# Patient Record
Sex: Female | Born: 1995 | Race: Black or African American | Hispanic: No | Marital: Single | Smoking: Never smoker
Health system: Southern US, Community
[De-identification: ages and names within clinical notes are randomized; demographics above are authoritative.]

## PROBLEM LIST (undated history)

## (undated) DIAGNOSIS — J45909 Unspecified asthma, uncomplicated: Secondary | ICD-10-CM

## (undated) DIAGNOSIS — I10 Essential (primary) hypertension: Secondary | ICD-10-CM

## (undated) DIAGNOSIS — Z9889 Other specified postprocedural states: Secondary | ICD-10-CM

## (undated) HISTORY — PX: OTHER SURGICAL HISTORY: SHX169

## (undated) HISTORY — DX: Essential (primary) hypertension: I10

## (undated) HISTORY — PX: HIP SURGERY: SHX245

---

## 2015-08-06 ENCOUNTER — Encounter (HOSPITAL_COMMUNITY): Payer: Self-pay | Admitting: Adult Health

## 2015-08-06 ENCOUNTER — Emergency Department (HOSPITAL_COMMUNITY)
Admission: EM | Admit: 2015-08-06 | Discharge: 2015-08-07 | Disposition: A | Discharge status: Home / Self Care | Payer: PRIVATE HEALTH INSURANCE | Attending: Emergency Medicine | Admitting: Emergency Medicine

## 2015-08-06 DIAGNOSIS — K2970 Gastritis, unspecified, without bleeding: Secondary | ICD-10-CM | POA: Insufficient documentation

## 2015-08-06 DIAGNOSIS — Z3202 Encounter for pregnancy test, result negative: Secondary | ICD-10-CM | POA: Diagnosis not present

## 2015-08-06 DIAGNOSIS — K529 Noninfective gastroenteritis and colitis, unspecified: Secondary | ICD-10-CM | POA: Diagnosis not present

## 2015-08-06 DIAGNOSIS — R1013 Epigastric pain: Secondary | ICD-10-CM | POA: Diagnosis present

## 2015-08-06 HISTORY — DX: Other specified postprocedural states: Z98.890

## 2015-08-06 LAB — COMPREHENSIVE METABOLIC PANEL
ALBUMIN: 3.5 g/dL (ref 3.5–5.0)
ALT: 15 U/L (ref 14–54)
AST: 22 U/L (ref 15–41)
Alkaline Phosphatase: 59 U/L (ref 38–126)
Anion gap: 12 (ref 5–15)
BUN: 7 mg/dL (ref 6–20)
CALCIUM: 8.9 mg/dL (ref 8.9–10.3)
CO2: 25 mmol/L (ref 22–32)
CREATININE: 0.69 mg/dL (ref 0.44–1.00)
Chloride: 102 mmol/L (ref 101–111)
Glucose, Bld: 104 mg/dL — ABNORMAL HIGH (ref 65–99)
Potassium: 3.7 mmol/L (ref 3.5–5.1)
SODIUM: 139 mmol/L (ref 135–145)
Total Bilirubin: 1.1 mg/dL (ref 0.3–1.2)
Total Protein: 7.1 g/dL (ref 6.5–8.1)

## 2015-08-06 LAB — URINALYSIS, ROUTINE W REFLEX MICROSCOPIC
Glucose, UA: NEGATIVE mg/dL
HGB URINE DIPSTICK: NEGATIVE
KETONES UR: NEGATIVE mg/dL
Leukocytes, UA: NEGATIVE
Nitrite: NEGATIVE
PROTEIN: NEGATIVE mg/dL
Specific Gravity, Urine: 1.028 (ref 1.005–1.030)
pH: 5.5 (ref 5.0–8.0)

## 2015-08-06 LAB — LIPASE, BLOOD: LIPASE: 18 U/L (ref 11–51)

## 2015-08-06 LAB — CBC
HCT: 41.3 % (ref 36.0–46.0)
Hemoglobin: 14 g/dL (ref 12.0–15.0)
MCH: 31.5 pg (ref 26.0–34.0)
MCHC: 33.9 g/dL (ref 30.0–36.0)
MCV: 92.8 fL (ref 78.0–100.0)
PLATELETS: 286 10*3/uL (ref 150–400)
RBC: 4.45 MIL/uL (ref 3.87–5.11)
RDW: 12.5 % (ref 11.5–15.5)
WBC: 5.1 10*3/uL (ref 4.0–10.5)

## 2015-08-06 LAB — POC URINE PREG, ED: PREG TEST UR: NEGATIVE

## 2015-08-06 MED ORDER — GLYCOPYRROLATE 0.2 MG/ML IJ SOLN
0.1000 mg | Freq: Once | INTRAMUSCULAR | Status: AC
  Administered 2015-08-07: 0.1 mg via INTRAVENOUS
  Filled 2015-08-06: qty 1

## 2015-08-06 MED ORDER — ONDANSETRON HCL 4 MG/2ML IJ SOLN
4.0000 mg | Freq: Once | INTRAMUSCULAR | Status: AC
  Administered 2015-08-07: 4 mg via INTRAVENOUS
  Filled 2015-08-06: qty 2

## 2015-08-06 MED ORDER — SODIUM CHLORIDE 0.9 % IV BOLUS (SEPSIS)
1000.0000 mL | Freq: Once | INTRAVENOUS | Status: AC
  Administered 2015-08-07: 1000 mL via INTRAVENOUS

## 2015-08-06 NOTE — ED Notes (Signed)
Presents with upper quadrant abdominal pain on both sides associated with nausea and emesis x1 began at 1 am today. Endorses loose stool x1 today. Is kepping applesauce and gatorade down. Explained to pt to not eat or drink anymore at this time, until evaluated by EDP. Abdominal pain rated 5/10 and described as gas pains

## 2015-08-06 NOTE — ED Notes (Signed)
MD at bedside. 

## 2015-08-06 NOTE — ED Notes (Signed)
RN attempt to start IV with no success; 2nd RN to start IV 

## 2015-08-07 MED ORDER — DICYCLOMINE HCL 20 MG PO TABS: 20.0000 mg | ORAL_TABLET | Freq: Two times a day (BID) | ORAL | Status: DC

## 2015-08-07 MED ORDER — ONDANSETRON 4 MG PO TBDP: 4.0000 mg | ORAL_TABLET | Freq: Three times a day (TID) | ORAL | Status: DC | PRN

## 2015-08-07 MED ORDER — OMEPRAZOLE 20 MG PO CPDR: 20.0000 mg | DELAYED_RELEASE_CAPSULE | Freq: Two times a day (BID) | ORAL | Status: DC

## 2015-08-07 NOTE — Discharge Instructions (Signed)

## 2015-08-07 NOTE — ED Provider Notes (Signed)
CSN: [id number]     Arrival date & time 08/06/15  2132 History   First MD Initiated Contact with Patient 08/06/15 2205     Chief Complaint  Patient presents with  . Abdominal Pain      HPI  Patient presents for evaluation of abdominal pain and nausea. He awakened proximal to 24 hours ago 1 AM with some nausea and epigastric cramping. One loose stool. Continued nausea during the day present only small amounts but has continued cramping and nausea and presents here. She states she ate some fish yesterday that "didn't settle" symptoms seem to worsen a few hours later. Heme-negative nonbilious emesis. No fevers no chills. No recent history of food intolerance. No lower quadrant pain per her last was her period was 2 weeks go normal. Urine no urinary symptoms.  Past Medical History  Diagnosis Date  . History of hip surgery    History reviewed. No pertinent past surgical history. History reviewed. No pertinent family history. Social History  Substance Use Topics  . Smoking status: Never Smoker   . Smokeless tobacco: None  . Alcohol Use: No   OB History    No data available     Review of Systems  Constitutional: Negative for fever, chills, diaphoresis, appetite change and fatigue.  HENT: Negative for mouth sores, sore throat and trouble swallowing.   Eyes: Negative for visual disturbance.  Respiratory: Negative for cough, chest tightness, shortness of breath and wheezing.   Cardiovascular: Negative for chest pain.  Gastrointestinal: Positive for nausea and abdominal pain. Negative for vomiting, diarrhea and abdominal distention.  Endocrine: Negative for polydipsia, polyphagia and polyuria.  Genitourinary: Negative for dysuria, frequency and hematuria.  Musculoskeletal: Negative for gait problem.  Skin: Negative for color change, pallor and rash.  Neurological: Negative for dizziness, syncope, light-headedness and headaches.  Hematological: Does not bruise/bleed easily.    Psychiatric/Behavioral: Negative for behavioral problems and confusion.      Allergies  Zyrtec  Home Medications   Prior to Admission medications   Medication Sig Start Date End Date Taking? Authorizing Provider  bismuth subsalicylate (PEPTO BISMOL) 262 MG/15ML suspension Take 30 mLs by mouth every 6 (six) hours as needed for diarrhea or loose stools.   Yes Historical Provider, MD  dicyclomine (BENTYL) 20 MG tablet Take 1 tablet (20 mg total) by mouth 2 (two) times daily. 08/07/15   Rolland Porter, MD  omeprazole (PRILOSEC) 20 MG capsule Take 1 capsule (20 mg total) by mouth 2 (two) times daily. 08/07/15   Rolland Porter, MD  ondansetron (ZOFRAN ODT) 4 MG disintegrating tablet Take 1 tablet (4 mg total) by mouth every 8 (eight) hours as needed for nausea. 08/07/15   Rolland Porter, MD   BP 143/78 mmHg  Pulse 94  Temp(Src) 100.1 F (37.8 C) (Oral)  Resp 18  SpO2 98%  LMP 08/01/2015 (Exact Date) Physical Exam  Constitutional: She is oriented to person, place, and time. She appears well-developed and well-nourished. No distress.  HENT:  Head: Normocephalic.  Eyes: Conjunctivae are normal. Pupils are equal, round, and reactive to light. No scleral icterus.  Neck: Normal range of motion. Neck supple. No thyromegaly present.  Cardiovascular: Normal rate and regular rhythm.  Exam reveals no gallop and no friction rub.   No murmur heard. Pulmonary/Chest: Effort normal and breath sounds normal. No respiratory distress. She has no wheezes. She has no rales.  Abdominal: Soft. Bowel sounds are normal. She exhibits no distension. There is no tenderness. There is no rebound.  Animal epigastric tenderness. No guarding rebound or perineal irritation.  Musculoskeletal: Normal range of motion.  Neurological: She is alert and oriented to person, place, and time.  Skin: Skin is warm and dry. No rash noted.  Psychiatric: She has a normal mood and affect. Her behavior is normal.    ED Course  Procedures  (including critical care time) Labs Review Labs Reviewed  COMPREHENSIVE METABOLIC PANEL - Abnormal; Notable for the following:    Glucose, Bld 104 (*)    All other components within normal limits  URINALYSIS, ROUTINE W REFLEX MICROSCOPIC (NOT AT [facility]) - Abnormal; Notable for the following:    Color, Urine AMBER (*)    APPearance HAZY (*)    Bilirubin Urine SMALL (*)    All other components within normal limits  LIPASE, BLOOD  CBC  POC URINE PREG, ED    Imaging Review No results found. I have personally reviewed and evaluated these images and lab results as part of my medical decision-making.   EKG Interpretation None      MDM   Final diagnoses:  Gastritis  Gastroenteritis    Given IV fluids, Robinul, Zofran. Feeling improved. He will take some by mouth. Plan will be home, Bentyl, Zofran, Prilosec. Slowly advance her diet. Avoid alcohol, tobacco, anti-inflammatories, caffeine.    Rolland Porter, MD 08/07/15 [place] Manners

## 2016-03-24 ENCOUNTER — Emergency Department (HOSPITAL_COMMUNITY)
Admission: EM | Admit: 2016-03-24 | Discharge: 2016-03-24 | Disposition: A | Discharge status: Home / Self Care | Payer: Medicaid Other | Attending: Emergency Medicine | Admitting: Emergency Medicine

## 2016-03-24 ENCOUNTER — Encounter (HOSPITAL_COMMUNITY): Payer: Self-pay | Admitting: Emergency Medicine

## 2016-03-24 ENCOUNTER — Emergency Department (HOSPITAL_COMMUNITY): Payer: Medicaid Other

## 2016-03-24 DIAGNOSIS — J40 Bronchitis, not specified as acute or chronic: Secondary | ICD-10-CM | POA: Insufficient documentation

## 2016-03-24 DIAGNOSIS — J069 Acute upper respiratory infection, unspecified: Secondary | ICD-10-CM

## 2016-03-24 DIAGNOSIS — Z79899 Other long term (current) drug therapy: Secondary | ICD-10-CM | POA: Insufficient documentation

## 2016-03-24 HISTORY — DX: Unspecified asthma, uncomplicated: J45.909

## 2016-03-24 MED ORDER — PREDNISONE 20 MG PO TABS
40.0000 mg | ORAL_TABLET | Freq: Once | ORAL | Status: AC
  Administered 2016-03-24: 40 mg via ORAL
  Filled 2016-03-24: qty 2

## 2016-03-24 MED ORDER — IPRATROPIUM-ALBUTEROL 0.5-2.5 (3) MG/3ML IN SOLN
3.0000 mL | RESPIRATORY_TRACT | Status: DC
  Administered 2016-03-24: 3 mL via RESPIRATORY_TRACT
  Filled 2016-03-24: qty 3

## 2016-03-24 MED ORDER — BENZONATATE 100 MG PO CAPS: 100.0000 mg | ORAL_CAPSULE | Freq: Three times a day (TID) | ORAL | 0 refills | Status: DC | PRN

## 2016-03-24 MED ORDER — ALBUTEROL SULFATE HFA 108 (90 BASE) MCG/ACT IN AERS
2.0000 | INHALATION_SPRAY | Freq: Once | RESPIRATORY_TRACT | Status: DC
  Filled 2016-03-24: qty 6.7

## 2016-03-24 MED ORDER — PREDNISONE 20 MG PO TABS: ORAL_TABLET | ORAL | 0 refills | Status: DC

## 2016-03-24 MED ORDER — ALBUTEROL SULFATE (5 MG/ML) 0.5% IN NEBU: 2.5000 mg | INHALATION_SOLUTION | Freq: Four times a day (QID) | RESPIRATORY_TRACT | 12 refills | Status: DC | PRN

## 2016-03-24 NOTE — ED Provider Notes (Signed)
[facility]-EMERGENCY DEPT Provider Note   CSN: [id number] Arrival date & time: 03/24/16  0747     History   Chief Complaint Chief Complaint  Patient presents with  . URI  . Asthma    HPI Tonya Krueger is a 20 y.o. female.  20 year old female who is here with a few days of cough, source of breath, chest pressure and shortness of breath. She has a history of asthma and had a sick contact with bronchitis. She denies fevers, chills. She does have a productive cough once a while of light yellow light green sputum. No Recent travels, other sick contacts, immunosuppression or other risk factors. She does not smoke. She is not using anything for her symptoms does not know what would make it better or worse.      Past Medical History:  Diagnosis Date  . Asthma   . History of hip surgery     There are no active problems to display for this patient.   Past Surgical History:  Procedure Laterality Date  . hip surgery      OB History    Gravida Para Term Preterm AB Living   1             SAB TAB Ectopic Multiple Live Births                   Home Medications    Prior to Admission medications   Medication Sig Start Date End Date Taking? Authorizing Provider  albuterol (PROVENTIL) (5 MG/ML) 0.5% nebulizer solution Take 0.5 mLs (2.5 mg total) by nebulization every 6 (six) hours as needed for wheezing or shortness of breath. 03/24/16   Marily MemosJason Yishai Rehfeld, MD  benzonatate (TESSALON) 100 MG capsule Take 1 capsule (100 mg total) by mouth 3 (three) times daily as needed for cough. 03/24/16   Marily MemosJason Carnell Beavers, MD  bismuth subsalicylate (PEPTO BISMOL) 262 MG/15ML suspension Take 30 mLs by mouth every 6 (six) hours as needed for diarrhea or loose stools.    Historical Provider, MD  dicyclomine (BENTYL) 20 MG tablet Take 1 tablet (20 mg total) by mouth 2 (two) times daily. 08/07/15   Tonya PorterMark James, MD  omeprazole (PRILOSEC) 20 MG capsule Take 1 capsule (20 mg total) by mouth 2 (two) times daily. 08/07/15    Tonya PorterMark James, MD  ondansetron (ZOFRAN ODT) 4 MG disintegrating tablet Take 1 tablet (4 mg total) by mouth every 8 (eight) hours as needed for nausea. 08/07/15   Tonya PorterMark James, MD  predniSONE (DELTASONE) 20 MG tablet 2 tabs po daily x 4 days 03/25/16   Marily MemosJason Abdiel Blackerby, MD    Family History No family history on file.  Social History Social History  Substance Use Topics  . Smoking status: Never Smoker  . Smokeless tobacco: Not on file  . Alcohol use No     Allergies   Zyrtec [cetirizine]   Review of Systems Review of Systems  Constitutional: Negative for activity change, appetite change, fatigue and fever.  HENT: Positive for congestion, postnasal drip and rhinorrhea.   Respiratory: Positive for cough, chest tightness and shortness of breath.   Cardiovascular: Positive for chest pain. Negative for palpitations and leg swelling.  Gastrointestinal: Negative for abdominal pain and nausea.  Genitourinary: Negative for dysuria.  Musculoskeletal: Negative for back pain.  All other systems reviewed and are negative.    Physical Exam Updated Vital Signs BP 124/61 (BP Location: Right Arm)   Pulse 80   Temp 98.4 F (36.9 C) (Oral)  Resp 20   Ht 5\' 8"  (1.727 m)   LMP 03/10/2016 Comment: not sexually active  SpO2 100%   Physical Exam  Constitutional: She appears well-developed and well-nourished. No distress.  HENT:  Head: Normocephalic and atraumatic.  Eyes: Conjunctivae are normal.  Neck: Neck supple.  Cardiovascular: Normal rate and regular rhythm.   No murmur heard. Pulmonary/Chest: Effort normal and breath sounds normal. No respiratory distress.  Decreased BS on R, mild end exp wheezing  Abdominal: Soft. There is no tenderness.  Musculoskeletal: She exhibits no edema.  Neurological: She is alert.  Skin: Skin is warm and dry.  Psychiatric: She has a normal mood and affect.  Nursing note and vitals reviewed.    ED Treatments / Results  Labs (all labs ordered are listed,  but only abnormal results are displayed) Labs Reviewed - No data to display  EKG  EKG Interpretation  Date/Time:  Monday March 24 2016 09:42:39 EDT Ventricular Rate:  71 PR Interval:    QRS Duration: 73 QT Interval:  377 QTC Calculation: 410 R Axis:   84 Text Interpretation:  Sinus rhythm Atrial premature complex No old tracing to compare Confirmed by [facility] MD, Barbara Cower [phone]) on 03/24/2016 9:46:59 AM       Radiology Dg Chest 2 View  Result Date: 03/24/2016 CLINICAL DATA:  Chest pain and shortness of breath.  Asthma. EXAM: CHEST  2 VIEW COMPARISON:  None. FINDINGS: Heart size is exaggerated by low lung volumes. Mild diffuse interstitial prominence is noted. No focal airspace disease is evident. Central airway thickening is present. The visualized soft tissues and bony thorax are unremarkable. IMPRESSION: 1. Moderate central airway thickening and interstitial prominence suggesting reactive airways disease. 2. No focal airspace disease. 3. Low lung volumes. Electronically Signed   By: Marin Roberts M.D.   On: 03/24/2016 10:16    Procedures Procedures (including critical care time)  Medications Ordered in ED Medications  ipratropium-albuterol (DUONEB) 0.5-2.5 (3) MG/3ML nebulizer solution 3 mL (3 mLs Nebulization Given 03/24/16 0940)  albuterol (PROVENTIL HFA;VENTOLIN HFA) 108 (90 Base) MCG/ACT inhaler 2 puff (not administered)  predniSONE (DELTASONE) tablet 40 mg (40 mg Oral Given 03/24/16 1022)     Initial Impression / Assessment and Plan / ED Course  I have reviewed the triage vital signs and the nursing notes.  Pertinent labs & imaging results that were available during my care of the patient were reviewed by me and considered in my medical decision making (see chart for details).  Clinical Course   Likely bronchitis. Will give duoneb/steroids. No indication for abx right now. Will cxr and ecg to ensure no other abnormalities.   Likely URI with asthma exacerbation.  Plan for supportive care at home.   Final Clinical Impressions(s) / ED Diagnoses   Final diagnoses:  URI (upper respiratory infection)  Bronchitis    New Prescriptions New Prescriptions   ALBUTEROL (PROVENTIL) (5 MG/ML) 0.5% NEBULIZER SOLUTION    Take 0.5 mLs (2.5 mg total) by nebulization every 6 (six) hours as needed for wheezing or shortness of breath.   BENZONATATE (TESSALON) 100 MG CAPSULE    Take 1 capsule (100 mg total) by mouth 3 (three) times daily as needed for cough.   PREDNISONE (DELTASONE) 20 MG TABLET    2 tabs po daily x 4 days     Marily Memos, MD 03/24/16 1029

## 2016-03-24 NOTE — ED Triage Notes (Addendum)
Pt states was dx with seasonal asthma in past-- started having URI symptoms on Friday-- does not have inhalers -- out of rx at present-- no resp distress at triage.  Pt states cough is productive for green mucus

## 2016-05-22 ENCOUNTER — Emergency Department (HOSPITAL_COMMUNITY)
Admission: EM | Admit: 2016-05-22 | Discharge: 2016-05-22 | Disposition: A | Discharge status: Home / Self Care | Payer: Medicaid Other | Attending: Emergency Medicine | Admitting: Emergency Medicine

## 2016-05-22 ENCOUNTER — Encounter (HOSPITAL_COMMUNITY): Payer: Self-pay | Admitting: Emergency Medicine

## 2016-05-22 DIAGNOSIS — J45909 Unspecified asthma, uncomplicated: Secondary | ICD-10-CM | POA: Insufficient documentation

## 2016-05-22 DIAGNOSIS — R109 Unspecified abdominal pain: Secondary | ICD-10-CM | POA: Insufficient documentation

## 2016-05-22 LAB — COMPREHENSIVE METABOLIC PANEL
ALBUMIN: 3.7 g/dL (ref 3.5–5.0)
ALT: 12 U/L — AB (ref 14–54)
AST: 15 U/L (ref 15–41)
Alkaline Phosphatase: 55 U/L (ref 38–126)
Anion gap: 5 (ref 5–15)
BUN: 8 mg/dL (ref 6–20)
CHLORIDE: 107 mmol/L (ref 101–111)
CO2: 26 mmol/L (ref 22–32)
CREATININE: 0.58 mg/dL (ref 0.44–1.00)
Calcium: 9.1 mg/dL (ref 8.9–10.3)
GFR calc Af Amer: >60 mL/min (ref >60)
GFR calc non Af Amer: >60 mL/min (ref >60)
GLUCOSE: 94 mg/dL (ref 65–99)
POTASSIUM: 4.2 mmol/L (ref 3.5–5.1)
SODIUM: 138 mmol/L (ref 135–145)
Total Bilirubin: 0.9 mg/dL (ref 0.3–1.2)
Total Protein: 6.9 g/dL (ref 6.5–8.1)

## 2016-05-22 LAB — CBC WITH DIFFERENTIAL/PLATELET
BASOS ABS: 0 10*3/uL (ref 0.0–0.1)
BASOS PCT: 1 %
EOS ABS: 0 10*3/uL (ref 0.0–0.7)
EOS PCT: 1 %
HCT: 39.1 % (ref 36.0–46.0)
Hemoglobin: 13.1 g/dL (ref 12.0–15.0)
LYMPHS PCT: 34 %
Lymphs Abs: 1.9 10*3/uL (ref 0.7–4.0)
MCH: 31.5 pg (ref 26.0–34.0)
MCHC: 33.5 g/dL (ref 30.0–36.0)
MCV: 94 fL (ref 78.0–100.0)
Monocytes Absolute: 0.5 10*3/uL (ref 0.1–1.0)
Monocytes Relative: 9 %
Neutro Abs: 3.1 10*3/uL (ref 1.7–7.7)
Neutrophils Relative %: 55 %
PLATELETS: 305 10*3/uL (ref 150–400)
RBC: 4.16 MIL/uL (ref 3.87–5.11)
RDW: 12.8 % (ref 11.5–15.5)
WBC: 5.5 10*3/uL (ref 4.0–10.5)

## 2016-05-22 LAB — URINALYSIS, ROUTINE W REFLEX MICROSCOPIC
BILIRUBIN URINE: NEGATIVE
Glucose, UA: NEGATIVE mg/dL
Hgb urine dipstick: NEGATIVE
Ketones, ur: NEGATIVE mg/dL
Leukocytes, UA: NEGATIVE
NITRITE: NEGATIVE
PROTEIN: NEGATIVE mg/dL
SPECIFIC GRAVITY, URINE: 1.017 (ref 1.005–1.030)
pH: 5.5 (ref 5.0–8.0)

## 2016-05-22 LAB — POC URINE PREG, ED: PREG TEST UR: NEGATIVE

## 2016-05-22 LAB — LIPASE, BLOOD: Lipase: 25 U/L (ref 11–51)

## 2016-05-22 MED ORDER — MELOXICAM 15 MG PO TABS: 15.0000 mg | ORAL_TABLET | Freq: Every day | ORAL | 0 refills | Status: DC

## 2016-05-22 NOTE — ED Notes (Signed)
Pt reports hx of the same pain in the past that went away on it's own after "some time."  Ambulatory to room with a quick easy step, smiling with visitor.

## 2016-05-22 NOTE — ED Triage Notes (Signed)
Pt sts left sided flank pain x 2 days into back; pt denies urinary sx

## 2016-05-22 NOTE — ED Provider Notes (Signed)
[facility] Provider Note   CSN: [id number] Arrival date & time: 05/22/16  0810     History   Chief Complaint Chief Complaint  Patient presents with  . Flank Pain    HPI Tonya Krueger is a 20 y.o. female who presents with left sided flank pain. PMH significant for asthma and hip pinning. She states that 2 days ago she started having pain over the left side of her back. The pain is constant. Movement makes it worse. Nothing has made it better. She has had this pain before and it has gone away on its own. Never been evaluated by a medical provider before for this problem. She denies any associated symptoms including fever, chills, abdominal pain, N/V/D, urinary symptoms, vaginal discharge or bleeding. She has not tried any medicines to help with her symptoms. 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   HPI  Past Medical History:  Diagnosis Date  . Asthma   . History of hip surgery     There are no active problems to display for this patient.   Past Surgical History:  Procedure Laterality Date  . hip surgery      OB History    Gravida Para Term Preterm AB Living   1             SAB TAB Ectopic Multiple Live Births                   Home Medications    Prior to Admission medications   Medication Sig Start Date End Date Taking? Authorizing Provider  albuterol (PROVENTIL) (5 MG/ML) 0.5% nebulizer solution Take 0.5 mLs (2.5 mg total) by nebulization every 6 (six) hours as needed for wheezing or shortness of breath. 03/24/16   Marily MemosJason Mesner, MD  benzonatate (TESSALON) 100 MG capsule Take 1 capsule (100 mg total) by mouth 3 (three) times daily as needed for cough. 03/24/16   Marily MemosJason Mesner, MD  bismuth subsalicylate (PEPTO BISMOL) 262 MG/15ML suspension Take 30 mLs by mouth every 6 (six) hours as needed for diarrhea or loose stools.    Historical Provider, MD  dicyclomine (BENTYL) 20 MG tablet Take 1 tablet (20 mg total) by mouth 2 (two) times daily.  08/07/15   Tonya PorterMark James, MD  omeprazole (PRILOSEC) 20 MG capsule Take 1 capsule (20 mg total) by mouth 2 (two) times daily. 08/07/15   Tonya PorterMark James, MD  ondansetron (ZOFRAN ODT) 4 MG disintegrating tablet Take 1 tablet (4 mg total) by mouth every 8 (eight) hours as needed for nausea. 08/07/15   Tonya PorterMark James, MD  predniSONE (DELTASONE) 20 MG tablet 2 tabs po daily x 4 days 03/25/16   Marily MemosJason Mesner, MD    Family History History reviewed. No pertinent family history.  Social History Social History  Substance Use Topics  . Smoking status: Never Smoker  . Smokeless tobacco: Not on file  . Alcohol use No     Allergies   Zyrtec [cetirizine]   Review of Systems Review of Systems  Constitutional: Negative for chills and fever.  Gastrointestinal: Negative for abdominal pain, diarrhea, nausea and vomiting.  Genitourinary: Positive for flank pain. Negative for dysuria, frequency, vaginal bleeding and vaginal discharge.  Musculoskeletal: Negative for back pain and gait problem.     Physical Exam Updated Vital Signs BP 133/78   Pulse 73   Temp 97.8 F (36.6 C) (Oral)   Resp 18   LMP 03/10/2016 Comment: not sexually active  SpO2 100%  Physical Exam  Constitutional: She is oriented to person, place, and time. She appears well-developed and well-nourished. No distress.  Obese, NAD. Appears comfortable.  HENT:  Head: Normocephalic and atraumatic.  Eyes: Conjunctivae are normal. Pupils are equal, round, and reactive to light. Right eye exhibits no discharge. Left eye exhibits no discharge. No scleral icterus.  Neck: Normal range of motion.  Cardiovascular: Normal rate and regular rhythm.  Exam reveals no gallop and no friction rub.   No murmur heard. Pulmonary/Chest: Breath sounds normal. No respiratory distress. She has no wheezes. She has no rales. She exhibits no tenderness.  Abdominal: Soft. Bowel sounds are normal. She exhibits no distension and no mass. There is no tenderness. There is no  rebound and no guarding. No hernia.  No CVA tenderness. Tenderness reproducible to palpation over left flank. No midline tenderness or paraspinal muscle tenderness   Neurological: She is alert and oriented to person, place, and time.  Skin: Skin is warm and dry.  Psychiatric: She has a normal mood and affect. Her behavior is normal.  Nursing note and vitals reviewed.    ED Treatments / Results  Labs (all labs ordered are listed, but only abnormal results are displayed) Labs Reviewed  COMPREHENSIVE METABOLIC PANEL - Abnormal; Notable for the following:       Result Value   ALT 12 (*)    All other components within normal limits  URINALYSIS, ROUTINE W REFLEX MICROSCOPIC (NOT AT [facility])  CBC WITH DIFFERENTIAL/PLATELET  LIPASE, BLOOD  POC URINE PREG, ED    EKG  EKG Interpretation None       Radiology No results found.  Procedures Procedures (including critical care time)  Medications Ordered in ED Medications - No data to display   Initial Impression / Assessment and Plan / ED Course  I have reviewed the triage vital signs and the nursing notes.  Pertinent labs & imaging results that were available during my care of the patient were reviewed by me and considered in my medical decision making (see chart for details).  Clinical Course    20 year old female presents with symptoms consistent with MSK flank/back pain. Patient is afebrile, not tachycardic or tachypneic, and not hypoxic. She is mildly hypertensive. Labs and UA are unremarkable. She appears comfortable and in minimal pain. Rx given for course of NSAIDs. Patient is NAD, non-toxic, with stable VS. Patient is informed of clinical course, understands medical decision making process, and agrees with plan. Opportunity for questions provided and all questions answered. Return precautions given.   Final Clinical Impressions(s) / ED Diagnoses   Final diagnoses:  Left flank pain    New Prescriptions New  Prescriptions   No medications on file     Bethel BornKelly Marie Gekas, PA-C 05/24/16 0945    Marily MemosJason Mesner, MD 05/24/16 1114

## 2016-05-22 NOTE — ED Notes (Signed)
Pt states she understands instructions. All questions answered.HOme stable with friend with steady gait.

## 2016-06-04 ENCOUNTER — Encounter (HOSPITAL_COMMUNITY): Payer: Self-pay | Admitting: *Deleted

## 2016-06-04 ENCOUNTER — Inpatient Hospital Stay (HOSPITAL_COMMUNITY)
Admission: AD | Admit: 2016-06-04 | Discharge: 2016-06-04 | Disposition: A | Discharge status: Home / Self Care | Payer: Medicaid Other | Source: Ambulatory Visit | Attending: Obstetrics & Gynecology | Admitting: Obstetrics & Gynecology

## 2016-06-04 DIAGNOSIS — N946 Dysmenorrhea, unspecified: Secondary | ICD-10-CM

## 2016-06-04 DIAGNOSIS — N939 Abnormal uterine and vaginal bleeding, unspecified: Secondary | ICD-10-CM | POA: Diagnosis not present

## 2016-06-04 DIAGNOSIS — N920 Excessive and frequent menstruation with regular cycle: Secondary | ICD-10-CM | POA: Diagnosis present

## 2016-06-04 DIAGNOSIS — Z3202 Encounter for pregnancy test, result negative: Secondary | ICD-10-CM | POA: Insufficient documentation

## 2016-06-04 LAB — URINALYSIS, ROUTINE W REFLEX MICROSCOPIC
BILIRUBIN URINE: NEGATIVE
Glucose, UA: NEGATIVE mg/dL
Ketones, ur: NEGATIVE mg/dL
Leukocytes, UA: NEGATIVE
NITRITE: NEGATIVE
PH: 7.5 (ref 5.0–8.0)
Protein, ur: NEGATIVE mg/dL
SPECIFIC GRAVITY, URINE: 1.015 (ref 1.005–1.030)

## 2016-06-04 LAB — POCT PREGNANCY, URINE: PREG TEST UR: NEGATIVE

## 2016-06-04 LAB — CBC
HEMATOCRIT: 37.7 % (ref 36.0–46.0)
Hemoglobin: 12.9 g/dL (ref 12.0–15.0)
MCH: 31.6 pg (ref 26.0–34.0)
MCHC: 34.2 g/dL (ref 30.0–36.0)
MCV: 92.4 fL (ref 78.0–100.0)
PLATELETS: 318 10*3/uL (ref 150–400)
RBC: 4.08 MIL/uL (ref 3.87–5.11)
RDW: 12.9 % (ref 11.5–15.5)
WBC: 5.2 10*3/uL (ref 4.0–10.5)

## 2016-06-04 LAB — URINE MICROSCOPIC-ADD ON
BACTERIA UA: NONE SEEN
WBC UA: NONE SEEN WBC/hpf (ref 0–5)

## 2016-06-04 MED ORDER — KETOROLAC TROMETHAMINE 60 MG/2ML IM SOLN
60.0000 mg | Freq: Once | INTRAMUSCULAR | Status: AC
  Administered 2016-06-04: 60 mg via INTRAMUSCULAR
  Filled 2016-06-04: qty 2

## 2016-06-04 MED ORDER — MEDROXYPROGESTERONE ACETATE 10 MG PO TABS: 10.0000 mg | ORAL_TABLET | Freq: Every day | ORAL | 0 refills | Status: DC

## 2016-06-04 MED ORDER — TRAMADOL HCL 50 MG PO TABS: 50.0000 mg | ORAL_TABLET | Freq: Four times a day (QID) | ORAL | 0 refills | Status: DC | PRN

## 2016-06-04 NOTE — Discharge Instructions (Signed)
Dysmenorrhea °Dysmenorrhea is pain during a menstrual period. You will have pain in the lower belly (abdomen). The pain is caused by the tightening (contracting) of the muscles of the uterus. The pain can be minor or severe. Headache, feeling sick to your stomach (nausea), throwing up (vomiting), or low back pain may occur with this condition. °Follow these instructions at home: °· Only take medicine as told by your doctor. °· Place a heating pad or hot water bottle on your lower back or belly. Do not sleep with a heating pad. °· Exercise may help lessen the pain. °· Massage the lower back or belly. °· Stop smoking. °· Avoid alcohol and caffeine. °Contact a doctor if: °· Your pain does not get better with medicine. °· You have pain during sex. °· Your pain gets worse while taking pain medicine. °· Your period bleeding is heavier than normal. °· You keep feeling sick to your stomach or keep throwing up. °Get help right away if: °· You pass out (faint). °This information is not intended to replace advice given to you by your health care provider. Make sure you discuss any questions you have with your health care provider. °Document Released: 09/19/2008 Document Revised: 11/29/2015 Document Reviewed: 12/09/2012 °Elsevier Interactive Patient Education © 2017 [employer] ° °

## 2016-06-04 NOTE — MAU Note (Signed)
Came in because menstrual cycles are really, really bad.  Really heavy and bad clots.  Cycles are always heavy, but the last 2 have been worse. Lasting longer and bad cramps

## 2016-06-04 NOTE — MAU Provider Note (Signed)
History     CSN: [id number]  Arrival date and time: 06/04/16 1416   First Provider Initiated Contact with Patient 06/04/16 1556      Chief Complaint  Patient presents with  . Menorrhagia  . Abdominal Pain   HPI   Ms.Rolland Porterya Dillavou is a 20 y.o. female G0P0 here in MAU with vaginal bleeding and abdominal pain. She has a history of heavy, painful periods. She has been worked up by her GYN due to a family history of uterine fibroids; all tests were negative. Her GYN Dr. Is in [place]. She has tried OCP's without relief. She is saturating more than a pad an hour. She has tried ibuprofen without much relief.   OB History    Gravida Para Term Preterm AB Living   0             SAB TAB Ectopic Multiple Live Births                  Past Medical History:  Diagnosis Date  . Asthma   . History of hip surgery     Past Surgical History:  Procedure Laterality Date  . hip surgery      History reviewed. No pertinent family history.  Social History  Substance Use Topics  . Smoking status: Never Smoker  . Smokeless tobacco: Not on file  . Alcohol use No    Allergies:  Allergies  Allergen Reactions  . Zyrtec [Cetirizine]     Itching and swelling    Prescriptions Prior to Admission  Medication Sig Dispense Refill Last Dose  . albuterol (PROVENTIL) (5 MG/ML) 0.5% nebulizer solution Take 0.5 mLs (2.5 mg total) by nebulization every 6 (six) hours as needed for wheezing or shortness of breath. 20 mL 12   . benzonatate (TESSALON) 100 MG capsule Take 1 capsule (100 mg total) by mouth 3 (three) times daily as needed for cough. 21 capsule 0   . bismuth subsalicylate (PEPTO BISMOL) 262 MG/15ML suspension Take 30 mLs by mouth every 6 (six) hours as needed for diarrhea or loose stools.   08/06/2015 at Unknown time  . dicyclomine (BENTYL) 20 MG tablet Take 1 tablet (20 mg total) by mouth 2 (two) times daily. 20 tablet 0   . meloxicam (MOBIC) 15 MG tablet Take 1 tablet (15 mg total) by mouth  daily. 30 tablet 0   . omeprazole (PRILOSEC) 20 MG capsule Take 1 capsule (20 mg total) by mouth 2 (two) times daily. 60 capsule 1   . ondansetron (ZOFRAN ODT) 4 MG disintegrating tablet Take 1 tablet (4 mg total) by mouth every 8 (eight) hours as needed for nausea. 6 tablet 0   . predniSONE (DELTASONE) 20 MG tablet 2 tabs po daily x 4 days 8 tablet 0    Results for orders placed or performed during the hospital encounter of 06/04/16 (from the past 48 hour(s))  Urinalysis, Routine w reflex microscopic (not at [facility])     Status: Abnormal   Collection Time: 06/04/16  2:53 PM  Result Value Ref Range   Color, Urine YELLOW YELLOW   APPearance CLEAR CLEAR   Specific Gravity, Urine 1.015 1.005 - 1.030   pH 7.5 5.0 - 8.0   Glucose, UA NEGATIVE NEGATIVE mg/dL   Hgb urine dipstick MODERATE (A) NEGATIVE   Bilirubin Urine NEGATIVE NEGATIVE   Ketones, ur NEGATIVE NEGATIVE mg/dL   Protein, ur NEGATIVE NEGATIVE mg/dL   Nitrite NEGATIVE NEGATIVE   Leukocytes, UA NEGATIVE NEGATIVE  Urine microscopic-add  on     Status: Abnormal   Collection Time: 06/04/16  2:53 PM  Result Value Ref Range   Squamous Epithelial / LPF 0-5 (A) NONE SEEN   WBC, UA NONE SEEN 0 - 5 WBC/hpf   RBC / HPF 6-30 0 - 5 RBC/hpf   Bacteria, UA NONE SEEN NONE SEEN  Pregnancy, urine POC     Status: None   Collection Time: 06/04/16  3:00 PM  Result Value Ref Range   Preg Test, Ur NEGATIVE NEGATIVE    Comment:        THE SENSITIVITY OF THIS METHODOLOGY IS >24 mIU/mL   CBC     Status: None   Collection Time: 06/04/16  4:01 PM  Result Value Ref Range   WBC 5.2 4.0 - 10.5 K/uL   RBC 4.08 3.87 - 5.11 MIL/uL   Hemoglobin 12.9 12.0 - 15.0 g/dL   HCT 78.237.7 95.636.0 - 21.346.0 %   MCV 92.4 78.0 - 100.0 fL   MCH 31.6 26.0 - 34.0 pg   MCHC 34.2 30.0 - 36.0 g/dL   RDW 08.612.9 57.811.5 - 46.915.5 %   Platelets 318 150 - 400 K/uL    Review of Systems  Gastrointestinal: Positive for abdominal pain.  Neurological: Positive for weakness. Negative for  dizziness.   Physical Exam   Blood pressure 136/89, pulse 73, temperature 98.6 F (37 C), temperature source Oral, resp. rate 20, weight (!) 305 lb 3.2 oz (138.4 kg), last menstrual period 06/02/2016, unknown if currently breastfeeding.  Physical Exam  Constitutional: She is oriented to person, place, and time. She appears well-developed and well-nourished. No distress.  HENT:  Head: Normocephalic.  Eyes: Pupils are equal, round, and reactive to light.  GI: Soft. She exhibits no distension. There is no tenderness. There is no rebound.  Genitourinary:  Genitourinary Comments: Vagina - Small amount of dark red blood in the vagina. Exam difficult due to patient's discomfort No contact bleeding, + active bleeding  Bimanual exam: Cervix closed Uterus non tender, normal size Adnexa non tender, no masses bilaterally Chaperone present for exam.   Musculoskeletal: Normal range of motion.  Neurological: She is alert and oriented to person, place, and time.  Skin: Skin is warm. She is not diaphoretic.  Psychiatric: Her behavior is normal.    MAU Course  Procedures  None   MDM  CBC showing stable Hgb Toradol 60 mg IM: "some relief".   Assessment and Plan   A:  1. Abnormal vaginal bleeding   2. Painful menstrual periods     P:  Discharge home in stable condition Patient has follow up in [place]; encouraged patient to follow up in the next week. Discussed IUD as an option  Rx: Ultram, Provera Bleeding precautions  Return to MAU as needed if symptoms worsen    Duane LopeJennifer I Peniel Hass, NP 06/04/2016 5:32 PM

## 2016-06-04 NOTE — MAU Note (Cosign Needed)
MAU HISTORY AND PHYSICAL  Chief Complaint:  Dysmenorrhea with pelvic cramping and menorrhagia  Tonya Porterya Harroun is a 20 y.o.  G0P0 presenting for dysmenorrhea and menorrhagia. Patient is on day 3 of her menstrual cycle and reports that both this cycle and her last cycle last month (which lasted 2 weeks) have been much heavier than usual with increased pelvic cramping. She has had heavy periods since menarche at age 20; periods occur each month but are unpredictable in day of onset and duration. She has been soaking through at least a pad an hour and sometimes uses double protection (super plus tampon with pad). During sleep, she has used a towel on the bed to avoid soaking into the sheets. She does have an ob/gyn in [place] who did an ultrasound but says it was a couple of years ago; she also tried OCPs for bleeding in the past with no improvement. She also reports feeling fatigued with body aches during her cycle. She denies ever having been sexually active. She denies fever, night sweats, chest pain, shortness of breath, vomiting, diarrhea, rashes, masses, and swelling.   Past Medical History:  Diagnosis Date  . Asthma   . History of hip surgery     Past Surgical History:  Procedure Laterality Date  . hip surgery bilaterally (SCFE) as teenager     Family History:  Mother diagnosed with uterine fibroids and cysts; aunt died from blood disorder but patient is unsure of name; thinks it could have been TTP.  Social History  Substance Use Topics  . Smoking status: Never Smoker  . Smokeless tobacco: Not on file  . Alcohol use No    Allergies  Allergen Reactions  . Zyrtec [Cetirizine]     Itching and swelling    Prescriptions Prior to Admission  Medication Sig Dispense Refill Last Dose  . albuterol (PROVENTIL) (5 MG/ML) 0.5% nebulizer solution Take 0.5 mLs (2.5 mg total) by nebulization every 6 (six) hours as needed for wheezing or shortness of breath. 20 mL 12    Review of Systems -  Negative except for what is mentioned in HPI.  Physical Exam  Blood pressure 136/89, pulse 73, temperature 98.6 F (37 C), temperature source Oral, resp. rate 20, weight (!) 138.4 kg (305 lb 3.2 oz), last menstrual period 06/02/2016 GEN: NAD, alert HEENT: NCAT, moist, pink mucous membranes NECK: No lymphadenopathy or thyromegaly PULM: Normal WOB on RA; lungs CTAB CV: RRR, no m/r/g ABD: NABS, NTND GU: normal external genitalia with no lesions; moderate blood in the vaginal vault; cervix not visualized (speculum exam terminated due to discomfort); no CMT or adnexal tenderness; no adnexal masses appreciated SKIN: warm and dry NEURO: moving all extremities spontaneously PSYCH: normal mood and affect  Labs: Results for orders placed or performed during the hospital encounter of 06/04/16 (from the past 24 hour(s))  Urinalysis, Routine w reflex microscopic (not at [facility])   Collection Time: 06/04/16  2:53 PM  Result Value Ref Range   Color, Urine YELLOW YELLOW   APPearance CLEAR CLEAR   Specific Gravity, Urine 1.015 1.005 - 1.030   pH 7.5 5.0 - 8.0   Glucose, UA NEGATIVE NEGATIVE mg/dL   Hgb urine dipstick MODERATE (A) NEGATIVE   Bilirubin Urine NEGATIVE NEGATIVE   Ketones, ur NEGATIVE NEGATIVE mg/dL   Protein, ur NEGATIVE NEGATIVE mg/dL   Nitrite NEGATIVE NEGATIVE   Leukocytes, UA NEGATIVE NEGATIVE  Urine microscopic-add on   Collection Time: 06/04/16  2:53 PM  Result Value Ref Range  Squamous Epithelial / LPF 0-5 (A) NONE SEEN   WBC, UA NONE SEEN 0 - 5 WBC/hpf   RBC / HPF 6-30 0 - 5 RBC/hpf   Bacteria, UA NONE SEEN NONE SEEN  Pregnancy, urine POC   Collection Time: 06/04/16  3:00 PM  Result Value Ref Range   Preg Test, Ur NEGATIVE NEGATIVE    Imaging Studies:  No results found.  Assessment: Tonya Krueger is  20 y.o. G0P0 on day 3 of her menstrual cycle with dysmenorrhea and pelvic cramping with menorrhagia that has worsened during the last two monthly cycles with some  fatigue but reassuring CBC (hgb 12.9 today versus 13.1 on 05/22/16). UA and urine pregnancy negative.  Plan: Patient received Toradol x 1 but still had pelvic cramping; she thinks she feels well enough to go to class tomorrow. Tramadol 50 mg every 6 hours (15 tablets total) for pain control at discharge. Recommended 2 weeks of treatment with Provera (10 mg/day) for control of menorrhagia and advised patient to follow up with her primary doctor over winter break to consider placement of Mirena IUD for better symptom control.   Ennis FortsJessica Vaani Morren, Medical Student [facility] 06/04/2016

## 2016-07-13 ENCOUNTER — Encounter (HOSPITAL_COMMUNITY): Payer: Self-pay | Admitting: Emergency Medicine

## 2016-07-13 ENCOUNTER — Emergency Department (HOSPITAL_COMMUNITY)
Admission: EM | Admit: 2016-07-13 | Discharge: 2016-07-13 | Disposition: A | Discharge status: Home / Self Care | Payer: Medicaid Other | Attending: Emergency Medicine | Admitting: Emergency Medicine

## 2016-07-13 ENCOUNTER — Emergency Department (HOSPITAL_COMMUNITY): Payer: Medicaid Other

## 2016-07-13 DIAGNOSIS — R05 Cough: Secondary | ICD-10-CM | POA: Diagnosis not present

## 2016-07-13 DIAGNOSIS — J45909 Unspecified asthma, uncomplicated: Secondary | ICD-10-CM | POA: Diagnosis not present

## 2016-07-13 DIAGNOSIS — R6889 Other general symptoms and signs: Secondary | ICD-10-CM

## 2016-07-13 MED ORDER — MOMETASONE FUROATE 50 MCG/ACT NA SUSP: 2.0000 | Freq: Every day | NASAL | 12 refills | Status: DC

## 2016-07-13 MED ORDER — BENZONATATE 100 MG PO CAPS: 100.0000 mg | ORAL_CAPSULE | Freq: Three times a day (TID) | ORAL | 0 refills | Status: DC | PRN

## 2016-07-13 NOTE — ED Notes (Signed)
Declined W/C at D/C and was escorted to lobby by RN. 

## 2016-07-13 NOTE — ED Provider Notes (Signed)
[facility]-EMERGENCY DEPT Provider Note   CSN: [id number] Arrival date & time: 07/13/16  1333  By signing my name below, I, Tonya Krueger, attest that this documentation has been prepared under the direction and in the presence of Loren Racer, MD. Electronically Signed: Alyssa Krueger, ED Scribe. 07/13/16. 2:26 PM.  History   Chief Complaint Chief Complaint  Patient presents with  . Cough   The history is provided by the patient. No language interpreter was used.   HPI Comments: Tonya Krueger is a 21 y.o. female who presents to the Emergency Department complaining of generalized body aches for 6 days. Pt reports associated non productive cough, rhinorrhea, nasal congestion, sneezing, Bilateral ear pressure, and intermittent chills. She has taken Mucinex Cold and Flu, but she has stopped taking that medication due to upset stomach. She is allergic to Zyrtec (throat itching) and has not taken any other antihistamines. Pt has an inhaler at home but has not used it. She reports recent sick contacts with flu-like illness. Pt denies fever, neck pain, back pain, chest pain, abdominal pain, wheezing.  Past Medical History:  Diagnosis Date  . Asthma   . History of hip surgery     There are no active problems to display for this patient.   Past Surgical History:  Procedure Laterality Date  . hip surgery      OB History    Gravida Para Term Preterm AB Living   0             SAB TAB Ectopic Multiple Live Births                   Home Medications    Prior to Admission medications   Medication Sig Start Date End Date Taking? Authorizing Provider  albuterol (PROVENTIL) (5 MG/ML) 0.5% nebulizer solution Take 0.5 mLs (2.5 mg total) by nebulization every 6 (six) hours as needed for wheezing or shortness of breath. 03/24/16   Marily Memos, MD  benzonatate (TESSALON) 100 MG capsule Take 1 capsule (100 mg total) by mouth 3 (three) times daily as needed for cough. 07/13/16   Loren Racer, MD    bismuth subsalicylate (PEPTO BISMOL) 262 MG/15ML suspension Take 30 mLs by mouth every 6 (six) hours as needed for diarrhea or loose stools.    Historical Provider, MD  dicyclomine (BENTYL) 20 MG tablet Take 1 tablet (20 mg total) by mouth 2 (two) times daily. 08/07/15   Rolland Porter, MD  medroxyPROGESTERone (PROVERA) 10 MG tablet Take 1 tablet (10 mg total) by mouth daily. 06/04/16   Duane Lope, NP  meloxicam (MOBIC) 15 MG tablet Take 1 tablet (15 mg total) by mouth daily. 05/22/16   Bethel Born, PA-C  mometasone (NASONEX) 50 MCG/ACT nasal spray Place 2 sprays into the nose daily. 07/13/16   Loren Racer, MD  omeprazole (PRILOSEC) 20 MG capsule Take 1 capsule (20 mg total) by mouth 2 (two) times daily. 08/07/15   Rolland Porter, MD  ondansetron (ZOFRAN ODT) 4 MG disintegrating tablet Take 1 tablet (4 mg total) by mouth every 8 (eight) hours as needed for nausea. 08/07/15   Rolland Porter, MD  predniSONE (DELTASONE) 20 MG tablet 2 tabs po daily x 4 days 03/25/16   Marily Memos, MD  traMADol (ULTRAM) 50 MG tablet Take 1 tablet (50 mg total) by mouth every 6 (six) hours as needed. 06/04/16   Duane Lope, NP    Family History No family history on file.  Social History Social History  Substance Use Topics  . Smoking status: Never Smoker  . Smokeless tobacco: Never Used  . Alcohol use No     Allergies   Zyrtec [cetirizine]   Review of Systems Review of Systems  Constitutional: Positive for chills and fatigue. Negative for fever.  HENT: Positive for congestion, ear pain, rhinorrhea, sinus pressure, sneezing and sore throat. Negative for ear discharge, facial swelling and hearing loss.   Eyes: Negative for pain and visual disturbance.  Respiratory: Positive for cough. Negative for shortness of breath and wheezing.   Cardiovascular: Negative for chest pain, palpitations and leg swelling.  Gastrointestinal: Negative for abdominal pain, diarrhea, nausea and vomiting.  Musculoskeletal:  Positive for myalgias. Negative for back pain, joint swelling, neck pain and neck stiffness.       + Generalized body aches  Skin: Negative for rash and wound.  Neurological: Negative for dizziness, weakness, light-headedness, numbness and headaches.  All other systems reviewed and are negative.    Physical Exam Updated Vital Signs BP 134/89   Pulse 79   Temp 98.1 F (36.7 C) (Oral)   Resp 20   LMP 07/01/2016   SpO2 100%   Physical Exam  Constitutional: She is oriented to person, place, and time. She appears well-developed and well-nourished. No distress.  HENT:  Head: Normocephalic and atraumatic.  Mouth/Throat: Oropharynx is clear and moist.  Bilateral nasal mucosal edema. Oropharynx is erythematous. Uvula is midline. No exudates. Bilateral TM bulging without erythema. No definite sinus tenderness to percussion.  Eyes: EOM are normal. Pupils are equal, round, and reactive to light.  Neck: Normal range of motion. Neck supple.  No meningismus. No cervical lymphadenopathy.  Cardiovascular: Normal rate and regular rhythm.  Exam reveals no gallop and no friction rub.   No murmur heard. Pulmonary/Chest: Effort normal and breath sounds normal. No respiratory distress. She has no wheezes. She has no rales. She exhibits no tenderness.  Abdominal: Soft. Bowel sounds are normal. There is no tenderness. There is no rebound and no guarding.  Musculoskeletal: Normal range of motion. She exhibits no edema or tenderness.  No CVA tenderness. No lower extremity swelling or pain.  Lymphadenopathy:    She has no cervical adenopathy.  Neurological: She is alert and oriented to person, place, and time.  Moving all extremities without deficit. Sensation intact. Walking without difficulty.  Skin: Skin is warm and dry. Capillary refill takes less than 2 seconds. No rash noted. No erythema.  Psychiatric: She has a normal mood and affect. Her behavior is normal.  Nursing note and vitals  reviewed.    ED Treatments / Results  DIAGNOSTIC STUDIES: Oxygen Saturation is 100% on RA, normal by my interpretation.    COORDINATION OF CARE: 2:17 PM Discussed treatment plan with pt at bedside which includes Chest XR and pt agreed to plan.  Labs (all labs ordered are listed, but only abnormal results are displayed) Labs Reviewed - No data to display  EKG  EKG Interpretation None       Radiology Dg Chest 2 View  Result Date: 07/13/2016 CLINICAL DATA:  Cough and rattling in the upper chest for 2 days. EXAM: CHEST  2 VIEW COMPARISON:  PA and lateral chest 03/24/2016. FINDINGS: The lungs are clear. Heart size is normal. No pneumothorax or pleural effusion. No bony abnormality. IMPRESSION: No acute disease. Electronically Signed   By: Drusilla Kannerhomas  Dalessio M.D.   On: 07/13/2016 14:48    Procedures Procedures (including critical care time)  Medications Ordered in ED Medications -  No data to display   Initial Impression / Assessment and Plan / ED Course  I have reviewed the triage vital signs and the nursing notes.  Pertinent labs & imaging results that were available during my care of the patient were reviewed by me and considered in my medical decision making (see chart for details).  Clinical Course    Patient is very well-appearing. Likely URI. Will get chest x-ray to rule out pneumonia. No pneumonia on x-ray. Given symptoms and present for nearly week, will not give Tamiflu. Will start on nasal steroid and tylenol for symptom control. Return precautions given. Final Clinical Impressions(s) / ED Diagnoses   Final diagnoses:  Flu-like symptoms    New Prescriptions New Prescriptions   MOMETASONE (NASONEX) 50 MCG/ACT NASAL SPRAY    Place 2 sprays into the nose daily.  I personally performed the services described in this documentation, which was scribed in my presence. The recorded information has been reviewed and is accurate.       Loren Racer, MD 07/13/16  [phone]

## 2016-07-13 NOTE — ED Triage Notes (Signed)
Cough for a few days and she has had body aches

## 2016-09-15 ENCOUNTER — Emergency Department (HOSPITAL_COMMUNITY): Payer: Medicaid Other

## 2016-09-15 ENCOUNTER — Emergency Department (HOSPITAL_COMMUNITY)
Admission: EM | Admit: 2016-09-15 | Discharge: 2016-09-15 | Disposition: A | Discharge status: Home / Self Care | Payer: Medicaid Other | Attending: Physician Assistant | Admitting: Physician Assistant

## 2016-09-15 ENCOUNTER — Encounter (HOSPITAL_COMMUNITY): Payer: Self-pay | Admitting: *Deleted

## 2016-09-15 DIAGNOSIS — R6889 Other general symptoms and signs: Secondary | ICD-10-CM

## 2016-09-15 DIAGNOSIS — R05 Cough: Secondary | ICD-10-CM | POA: Insufficient documentation

## 2016-09-15 DIAGNOSIS — J45909 Unspecified asthma, uncomplicated: Secondary | ICD-10-CM | POA: Diagnosis not present

## 2016-09-15 DIAGNOSIS — Z79899 Other long term (current) drug therapy: Secondary | ICD-10-CM | POA: Diagnosis not present

## 2016-09-15 DIAGNOSIS — R509 Fever, unspecified: Secondary | ICD-10-CM | POA: Diagnosis present

## 2016-09-15 MED ORDER — IBUPROFEN 800 MG PO TABS
800.0000 mg | ORAL_TABLET | Freq: Once | ORAL | Status: AC
  Administered 2016-09-15: 800 mg via ORAL
  Filled 2016-09-15: qty 1

## 2016-09-15 MED ORDER — ACETAMINOPHEN 500 MG PO TABS
500.0000 mg | ORAL_TABLET | Freq: Once | ORAL | Status: AC
  Administered 2016-09-15: 500 mg via ORAL
  Filled 2016-09-15: qty 1

## 2016-09-15 NOTE — ED Triage Notes (Signed)
The pt is c/o general body aches headache chills cough cold since yesterday  lmp last month

## 2016-09-15 NOTE — ED Notes (Signed)
Patient transported to X-ray 

## 2016-09-15 NOTE — ED Provider Notes (Signed)
[facility]PT Provider Note   CSN: [id number] Arrival date & time: 09/15/16  2024     History   Chief Complaint Chief Complaint  Patient presents with  . Generalized Body Aches    HPI Tonya Krueger is a 21 y.o. female.  HPI   She does a 21 year old female presenting with 3 days of subjective chills and fever, body aches, sneezing, mild cough.  She has been eating and drinking normally. No neck pain. Mild fatigue.  Past Medical History:  Diagnosis Date  . Asthma   . History of hip surgery     There are no active problems to display for this patient.   Past Surgical History:  Procedure Laterality Date  . hip surgery      OB History    Gravida Para Term Preterm AB Living   0             SAB TAB Ectopic Multiple Live Births                   Home Medications    Prior to Admission medications   Medication Sig Start Date End Date Taking? Authorizing Provider  albuterol (PROVENTIL) (5 MG/ML) 0.5% nebulizer solution Take 0.5 mLs (2.5 mg total) by nebulization every 6 (six) hours as needed for wheezing or shortness of breath. 03/24/16   Marily Memos, MD  benzonatate (TESSALON) 100 MG capsule Take 1 capsule (100 mg total) by mouth 3 (three) times daily as needed for cough. 07/13/16   Loren Racer, MD  bismuth subsalicylate (PEPTO BISMOL) 262 MG/15ML suspension Take 30 mLs by mouth every 6 (six) hours as needed for diarrhea or loose stools.    Historical Provider, MD  dicyclomine (BENTYL) 20 MG tablet Take 1 tablet (20 mg total) by mouth 2 (two) times daily. 08/07/15   Rolland Porter, MD  medroxyPROGESTERone (PROVERA) 10 MG tablet Take 1 tablet (10 mg total) by mouth daily. 06/04/16   Duane Lope, NP  meloxicam (MOBIC) 15 MG tablet Take 1 tablet (15 mg total) by mouth daily. 05/22/16   Bethel Born, PA-C  mometasone (NASONEX) 50 MCG/ACT nasal spray Place 2 sprays into the nose daily. 07/13/16   Loren Racer, MD  omeprazole (PRILOSEC) 20 MG capsule Take 1  capsule (20 mg total) by mouth 2 (two) times daily. 08/07/15   Rolland Porter, MD  ondansetron (ZOFRAN ODT) 4 MG disintegrating tablet Take 1 tablet (4 mg total) by mouth every 8 (eight) hours as needed for nausea. 08/07/15   Rolland Porter, MD  predniSONE (DELTASONE) 20 MG tablet 2 tabs po daily x 4 days 03/25/16   Marily Memos, MD  traMADol (ULTRAM) 50 MG tablet Take 1 tablet (50 mg total) by mouth every 6 (six) hours as needed. 06/04/16   Duane Lope, NP    Family History No family history on file.  Social History Social History  Substance Use Topics  . Smoking status: Never Smoker  . Smokeless tobacco: Never Used  . Alcohol use No     Allergies   Zyrtec [cetirizine]   Review of Systems Review of Systems  Constitutional: Positive for chills and fatigue. Negative for activity change.  HENT: Positive for congestion and sneezing.   Respiratory: Positive for cough. Negative for chest tightness.   Cardiovascular: Negative for chest pain.  Gastrointestinal: Negative for abdominal distention.  Skin: Negative for rash.  All other systems reviewed and are negative.    Physical Exam Updated Vital Signs BP 121/59 (BP  Location: Left Arm)   Pulse 106   Temp 98.1 F (36.7 C) (Oral)   Resp 18   Ht 5\' 8"  (1.727 m)   Wt (!) 302 lb 5 oz (137.1 kg)   LMP 08/18/2016   SpO2 100%   BMI 45.97 kg/m   Physical Exam  Constitutional: She is oriented to person, place, and time. She appears well-developed and well-nourished.  HENT:  Head: Normocephalic and atraumatic.  Eyes: Right eye exhibits no discharge. Left eye exhibits no discharge.  Cardiovascular: Normal rate, regular rhythm and normal heart sounds.   No murmur heard. Pulmonary/Chest: Effort normal and breath sounds normal. She has no wheezes. She has no rales.  Abdominal: Soft. She exhibits no distension. There is no tenderness.  Neurological: She is oriented to person, place, and time.  Skin: Skin is warm and dry. She is not  diaphoretic.  Psychiatric: She has a normal mood and affect.  Nursing note and vitals reviewed.    ED Treatments / Results  Labs (all labs ordered are listed, but only abnormal results are displayed) Labs Reviewed - No data to display  EKG  EKG Interpretation None       Radiology No results found.  Procedures Procedures (including critical care time)  Medications Ordered in ED Medications  ibuprofen (ADVIL,MOTRIN) tablet 800 mg (not administered)  acetaminophen (TYLENOL) tablet 500 mg (not administered)     Initial Impression / Assessment and Plan / ED Course  I have reviewed the triage vital signs and the nursing notes.  Pertinent labs & imaging results that were available during my care of the patient were reviewed by me and considered in my medical decision making (see chart for details).     Patient is well-appearing 21 year old female presenting with fever and  body aches. Patient eating drinking normally. She appears very well on exam. We will treat her pain with ibuprofen and Tylenol. Get chest x-ray she to make sure she does not have pneumonia. A suspect  flulike illness. Plan to discharge home with instructions for fluid, rest.  Final Clinical Impressions(s) / ED Diagnoses   Final diagnoses:  None    New Prescriptions New Prescriptions   No medications on file     Edmar Blankenburg Randall AnLyn Ian Castagna, MD 09/18/16 [id number]

## 2016-09-15 NOTE — ED Notes (Signed)
Pt stable, ambulatory, states understanding of discharge instructions 

## 2017-02-17 ENCOUNTER — Emergency Department (HOSPITAL_COMMUNITY)
Admission: EM | Admit: 2017-02-17 | Discharge: 2017-02-17 | Disposition: A | Discharge status: Home / Self Care | Payer: PRIVATE HEALTH INSURANCE | Attending: Emergency Medicine | Admitting: Emergency Medicine

## 2017-02-17 ENCOUNTER — Encounter (HOSPITAL_COMMUNITY): Payer: Self-pay | Admitting: Emergency Medicine

## 2017-02-17 DIAGNOSIS — J019 Acute sinusitis, unspecified: Secondary | ICD-10-CM | POA: Insufficient documentation

## 2017-02-17 DIAGNOSIS — B349 Viral infection, unspecified: Secondary | ICD-10-CM | POA: Insufficient documentation

## 2017-02-17 DIAGNOSIS — J45909 Unspecified asthma, uncomplicated: Secondary | ICD-10-CM | POA: Insufficient documentation

## 2017-02-17 MED ORDER — BENZONATATE 100 MG PO CAPS
200.0000 mg | ORAL_CAPSULE | Freq: Once | ORAL | Status: AC
  Administered 2017-02-17: 200 mg via ORAL
  Filled 2017-02-17: qty 2

## 2017-02-17 MED ORDER — IBUPROFEN 800 MG PO TABS
800.0000 mg | ORAL_TABLET | Freq: Once | ORAL | Status: AC
  Administered 2017-02-17: 800 mg via ORAL
  Filled 2017-02-17: qty 1

## 2017-02-17 MED ORDER — ALBUTEROL SULFATE (5 MG/ML) 0.5% IN NEBU: 2.5000 mg | INHALATION_SOLUTION | Freq: Four times a day (QID) | RESPIRATORY_TRACT | 12 refills | Status: AC | PRN

## 2017-02-17 MED ORDER — IBUPROFEN 600 MG PO TABS: 600.0000 mg | ORAL_TABLET | Freq: Four times a day (QID) | ORAL | 0 refills | Status: DC | PRN

## 2017-02-17 MED ORDER — MOMETASONE FUROATE 50 MCG/ACT NA SUSP: 2.0000 | Freq: Every day | NASAL | 0 refills | Status: DC

## 2017-02-17 MED ORDER — MAGIC MOUTHWASH W/LIDOCAINE: 5.0000 mL | Freq: Four times a day (QID) | ORAL | 0 refills | Status: DC | PRN

## 2017-02-17 MED ORDER — BENZONATATE 100 MG PO CAPS: 100.0000 mg | ORAL_CAPSULE | Freq: Three times a day (TID) | ORAL | 0 refills | Status: DC | PRN

## 2017-02-17 NOTE — ED Provider Notes (Signed)
[facility]-EMERGENCY DEPT Provider Note   CSN: [id number] Arrival date & time: 02/17/17  0214    History   Chief Complaint Chief Complaint  Patient presents with  . URI    HPI Tonya Krueger is a 21 y.o. female.  The history is provided by the patient. No language interpreter was used.  URI   This is a new problem. The current episode started yesterday. There has been no fever. Associated symptoms include congestion, plugged ear sensation, sinus pain, sore throat, cough and wheezing. Pertinent negatives include no diarrhea, no nausea, no vomiting and no rash. She has tried an inhaler for the symptoms. The treatment provided no relief.    Past Medical History:  Diagnosis Date  . Asthma   . History of hip surgery     There are no active problems to display for this patient.   Past Surgical History:  Procedure Laterality Date  . hip surgery      OB History    Gravida Para Term Preterm AB Living   0             SAB TAB Ectopic Multiple Live Births                   Home Medications    Prior to Admission medications   Medication Sig Start Date End Date Taking? Authorizing Provider  albuterol (PROVENTIL) (5 MG/ML) 0.5% nebulizer solution Take 0.5 mLs (2.5 mg total) by nebulization every 6 (six) hours as needed for wheezing or shortness of breath. 02/17/17   Antony MaduraHumes, Lizvet Chunn, PA-C  benzonatate (TESSALON) 100 MG capsule Take 1 capsule (100 mg total) by mouth 3 (three) times daily as needed for cough. 02/17/17   Antony MaduraHumes, Ewel Lona, PA-C  dicyclomine (BENTYL) 20 MG tablet Take 1 tablet (20 mg total) by mouth 2 (two) times daily. Patient not taking: Reported on 09/15/2016 08/07/15   Tonya PorterJames, Mark, MD  ibuprofen (ADVIL,MOTRIN) 600 MG tablet Take 1 tablet (600 mg total) by mouth every 6 (six) hours as needed for headache, mild pain or moderate pain. 02/17/17   Antony MaduraHumes, Brenten Janney, PA-C  magic mouthwash w/lidocaine SOLN Take 5 mLs by mouth 4 (four) times daily as needed (sore throat). 02/17/17   Antony MaduraHumes, Jamarri Vuncannon,  PA-C  medroxyPROGESTERone (PROVERA) 10 MG tablet Take 1 tablet (10 mg total) by mouth daily. Patient not taking: Reported on 09/15/2016 06/04/16   Rasch, Victorino DikeJennifer I, NP  meloxicam (MOBIC) 15 MG tablet Take 1 tablet (15 mg total) by mouth daily. Patient not taking: Reported on 09/15/2016 05/22/16   Bethel BornGekas, Saintclair Schroader Marie, PA-C  mometasone (NASONEX) 50 MCG/ACT nasal spray Place 2 sprays into the nose daily. 02/17/17   Antony MaduraHumes, Elmar Antigua, PA-C  omeprazole (PRILOSEC) 20 MG capsule Take 1 capsule (20 mg total) by mouth 2 (two) times daily. Patient not taking: Reported on 09/15/2016 08/07/15   Tonya PorterJames, Mark, MD  ondansetron (ZOFRAN ODT) 4 MG disintegrating tablet Take 1 tablet (4 mg total) by mouth every 8 (eight) hours as needed for nausea. Patient not taking: Reported on 09/15/2016 08/07/15   Tonya PorterJames, Mark, MD  predniSONE (DELTASONE) 20 MG tablet 2 tabs po daily x 4 days Patient not taking: Reported on 09/15/2016 03/25/16   Mesner, Barbara CowerJason, MD  traMADol (ULTRAM) 50 MG tablet Take 1 tablet (50 mg total) by mouth every 6 (six) hours as needed. Patient not taking: Reported on 09/15/2016 06/04/16   Rasch, Harolyn RutherfordJennifer I, NP    Family History History reviewed. No pertinent family history.  Social History Social  History  Substance Use Topics  . Smoking status: Never Smoker  . Smokeless tobacco: Never Used  . Alcohol use No     Allergies   Zyrtec [cetirizine]   Review of Systems Review of Systems  HENT: Positive for congestion, sinus pain and sore throat.   Respiratory: Positive for cough and wheezing.   Gastrointestinal: Negative for diarrhea, nausea and vomiting.  Skin: Negative for rash.  Ten systems reviewed and are negative for acute change, except as noted in the HPI.    Physical Exam Updated Vital Signs BP (!) 146/84 (BP Location: Left Arm)   Pulse 73   Temp 97.9 F (36.6 C) (Oral)   Resp 18   Ht 5\' 8"  (1.727 m)   LMP 02/02/2017   SpO2 100%   Physical Exam  Constitutional: She is oriented to  person, place, and time. She appears well-developed and well-nourished. No distress.  HENT:  Head: Normocephalic and atraumatic.  Uvula midline. Oropharynx clear. Patient tolerating secretions without difficulty. Mild middle ear effusion on the right. TMs otherwise normal. Cone of light intact.  Eyes: Conjunctivae and EOM are normal. No scleral icterus.  Neck: Normal range of motion.  No nuchal rigidity or meningismus  Cardiovascular: Normal rate, regular rhythm and intact distal pulses.   Pulmonary/Chest: Effort normal. No respiratory distress. She has no wheezes. She has no rales.  Respirations even and unlabored. Lungs clear to auscultation bilaterally.  Musculoskeletal: Normal range of motion.  Neurological: She is alert and oriented to person, place, and time. She exhibits normal muscle tone. Coordination normal.  GCS 15. Patient moving all extremities.  Skin: Skin is warm and dry. No rash noted. She is not diaphoretic. No erythema. No pallor.  Psychiatric: She has a normal mood and affect. Her behavior is normal.  Nursing note and vitals reviewed.    ED Treatments / Results  Labs (all labs ordered are listed, but only abnormal results are displayed) Labs Reviewed - No data to display  EKG  EKG Interpretation None       Radiology No results found.  Procedures Procedures (including critical care time)  Medications Ordered in ED Medications  ibuprofen (ADVIL,MOTRIN) tablet 800 mg (not administered)  benzonatate (TESSALON) capsule 200 mg (not administered)     Initial Impression / Assessment and Plan / ED Course  I have reviewed the triage vital signs and the nursing notes.  Pertinent labs & imaging results that were available during my care of the patient were reviewed by me and considered in my medical decision making (see chart for details).     Patient complaining of symptoms of sinusitis. Mild to moderate symptoms of clear/yellow nasal discharge/congestion  and scratchy throat with cough for less than 10 days. Patient is afebrile. No concern for acute bacterial rhinosinusitis; likely viral in nature. Patient discharged with symptomatic treatment. Return precautions discussed and provided. Patient discharged in stable condition with no unaddressed concerns.   Final Clinical Impressions(s) / ED Diagnoses   Final diagnoses:  Acute sinusitis, recurrence not specified, unspecified location  Viral illness    New Prescriptions New Prescriptions   BENZONATATE (TESSALON) 100 MG CAPSULE    Take 1 capsule (100 mg total) by mouth 3 (three) times daily as needed for cough.   IBUPROFEN (ADVIL,MOTRIN) 600 MG TABLET    Take 1 tablet (600 mg total) by mouth every 6 (six) hours as needed for headache, mild pain or moderate pain.   MAGIC MOUTHWASH W/LIDOCAINE SOLN    Take 5 mLs  by mouth 4 (four) times daily as needed (sore throat).     Antony Madura, PA-C 02/17/17 1610    Dione Booze, MD 02/17/17 [phone]

## 2017-02-17 NOTE — ED Triage Notes (Signed)
Pt reports having cold symptoms with nasal congestin, body aches and ear pain. Pt reports that symptoms started yesterday.

## 2017-11-16 ENCOUNTER — Ambulatory Visit: Payer: Self-pay | Admitting: Allergy

## 2017-12-17 ENCOUNTER — Ambulatory Visit: Payer: Self-pay | Admitting: Allergy

## 2018-02-26 IMAGING — CR DG CHEST 2V
2 series · 2 of 2 positions shown · non-contrast
Comparison: Chest x-ray 07/13/2016.

CLINICAL DATA: 20-year-old female with history of productive cough,
congestion and fever for 1 day.

EXAM:
CHEST  2 VIEW

[chest pa]
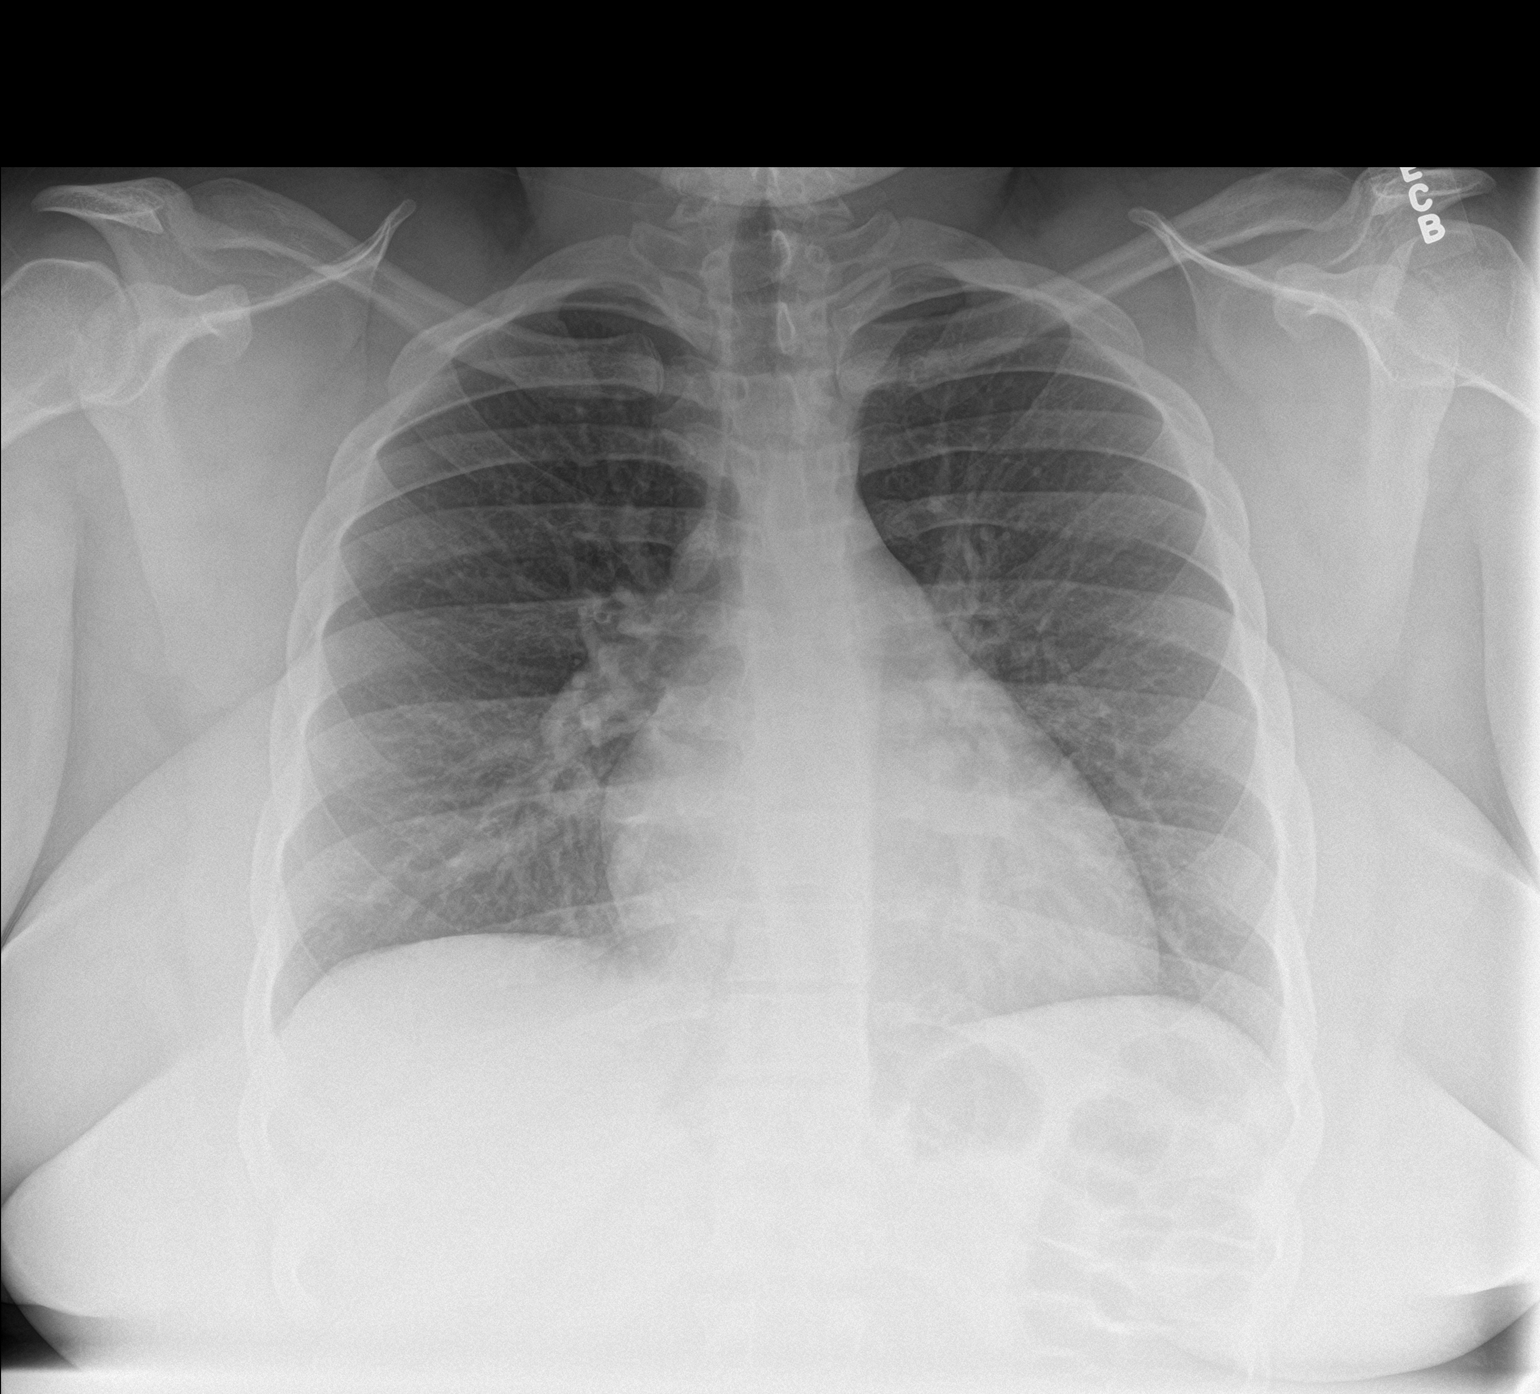

[chest lat]
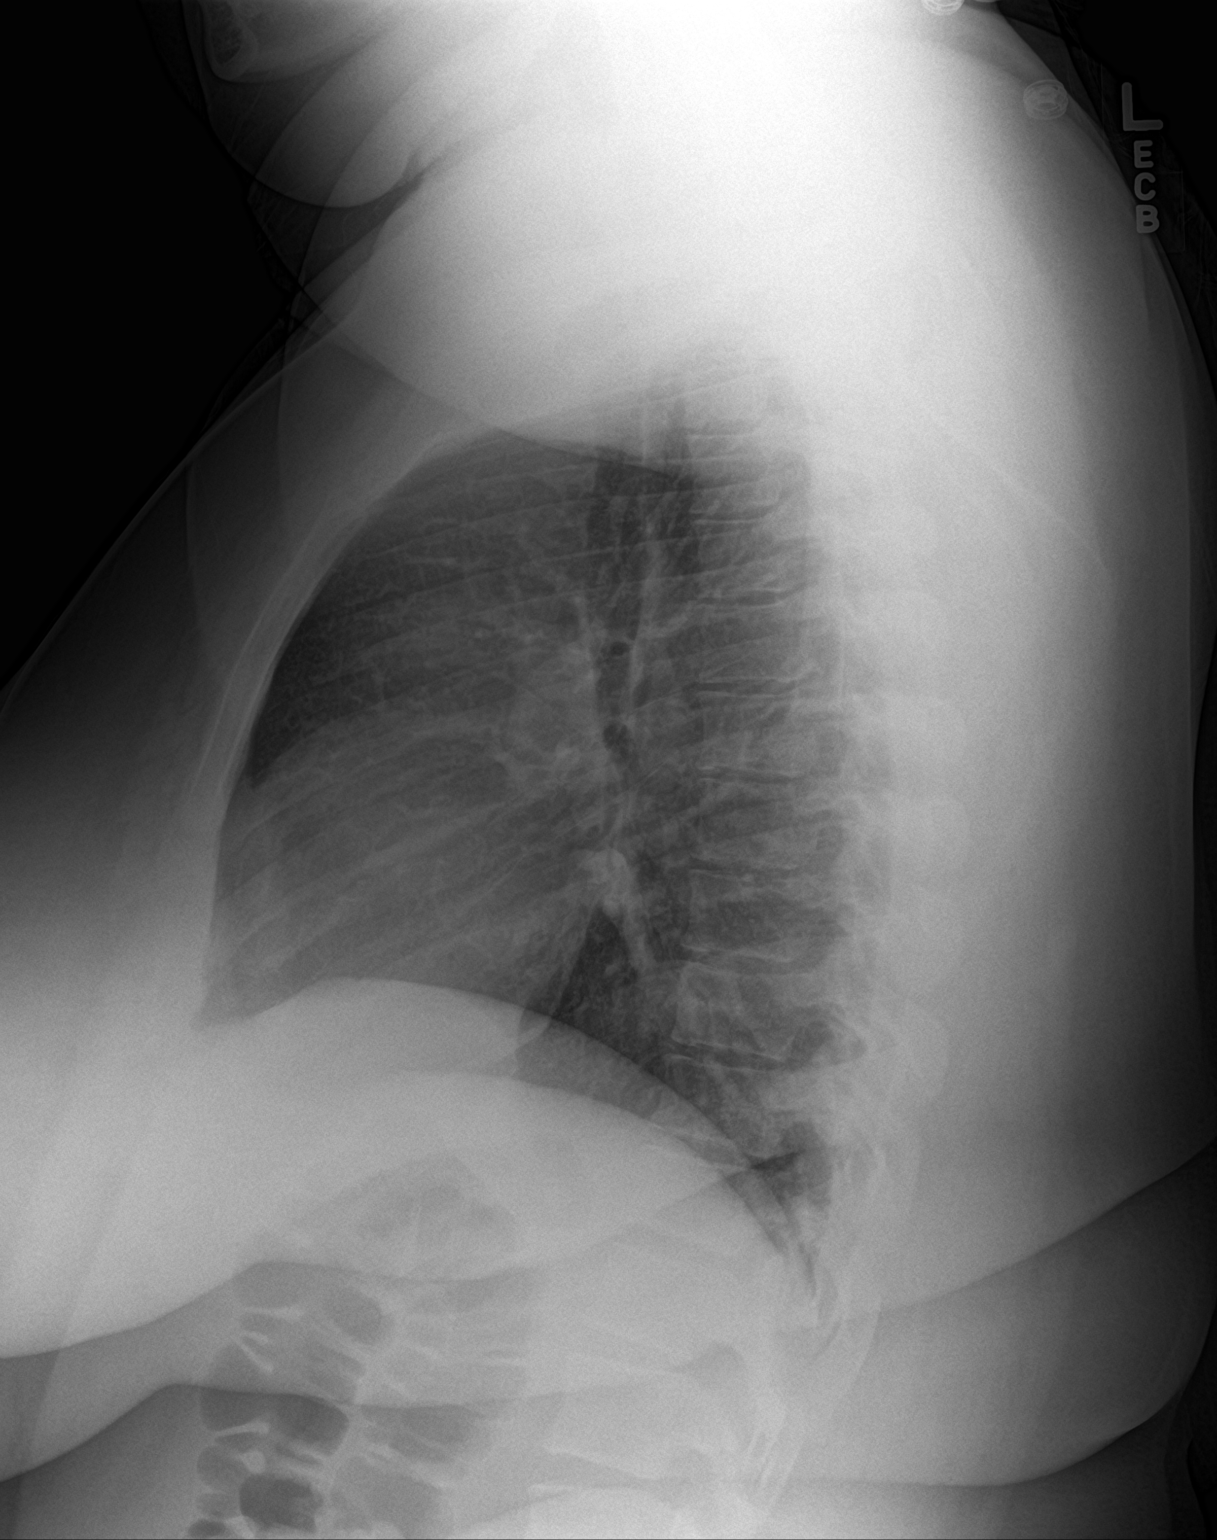

[2 of 2 positions shown; findings below may reference images not displayed]

FINDINGS: Lung volumes are normal. No consolidative airspace disease. No
pleural effusions. No pneumothorax. No pulmonary nodule or mass
noted. Pulmonary vasculature and the cardiomediastinal silhouette
are within normal limits.
IMPRESSION: No radiographic evidence of acute cardiopulmonary disease.

## 2018-03-31 ENCOUNTER — Emergency Department (HOSPITAL_COMMUNITY)
Admission: EM | Admit: 2018-03-31 | Discharge: 2018-03-31 | Disposition: A | Discharge status: Home / Self Care | Payer: BLUE CROSS/BLUE SHIELD | Attending: Emergency Medicine | Admitting: Emergency Medicine

## 2018-03-31 ENCOUNTER — Encounter (HOSPITAL_COMMUNITY): Payer: Self-pay | Admitting: Emergency Medicine

## 2018-03-31 ENCOUNTER — Other Ambulatory Visit: Payer: Self-pay

## 2018-03-31 DIAGNOSIS — J45909 Unspecified asthma, uncomplicated: Secondary | ICD-10-CM | POA: Insufficient documentation

## 2018-03-31 DIAGNOSIS — H9201 Otalgia, right ear: Secondary | ICD-10-CM | POA: Insufficient documentation

## 2018-03-31 NOTE — ED Notes (Signed)
Bed: [facility] Expected date:  Expected time:  Means of arrival:  Comments: 

## 2018-03-31 NOTE — ED Triage Notes (Addendum)
Patient complaining of right side of ear and head that started three days ago. Patient is not having any other symptoms. Patient took Excedrin but it did not help.

## 2018-03-31 NOTE — ED Provider Notes (Signed)
[facility]-EMERGENCY DEPT Provider Note   CSN: [id number] Arrival date & time: 03/31/18  0004     History   Chief Complaint Chief Complaint  Patient presents with  . Otalgia    HPI Penda Venturi is a 22 y.o. female who presents with right ear pain. PMH significant for allergies. She states that she developed right ear pain about three days ago. It is constant and radiates to the R temple. She has slightly muffled hearing and her ear feels full. She denies fever, URI symptoms, left ear pain. She has had similar pain before but was diagnosed with fluid behind the ear. At that time she was having sinus congestion. She reports multiple allergies and is planning on going to an allergist. She also has a medication allergy to Zyrtec. She can take Benadryl but hasn't tried this. She took Excedrin without significant relief.   HPI  Past Medical History:  Diagnosis Date  . Asthma   . History of hip surgery     There are no active problems to display for this patient.   Past Surgical History:  Procedure Laterality Date  . hip surgery       OB History    Gravida  0   Para      Term      Preterm      AB      Living        SAB      TAB      Ectopic      Multiple      Live Births               Home Medications    Prior to Admission medications   Medication Sig Start Date End Date Taking? Authorizing Provider  albuterol (PROVENTIL) (5 MG/ML) 0.5% nebulizer solution Take 0.5 mLs (2.5 mg total) by nebulization every 6 (six) hours as needed for wheezing or shortness of breath. 02/17/17   Antony Madura, PA-C  benzonatate (TESSALON) 100 MG capsule Take 1 capsule (100 mg total) by mouth 3 (three) times daily as needed for cough. 02/17/17   Antony Madura, PA-C  ibuprofen (ADVIL,MOTRIN) 600 MG tablet Take 1 tablet (600 mg total) by mouth every 6 (six) hours as needed for headache, mild pain or moderate pain. 02/17/17   Antony Madura, PA-C  magic mouthwash  w/lidocaine SOLN Take 5 mLs by mouth 4 (four) times daily as needed (sore throat). 02/17/17   Antony Madura, PA-C  mometasone (NASONEX) 50 MCG/ACT nasal spray Place 2 sprays into the nose daily. 02/17/17   Antony Madura, PA-C    Family History History reviewed. No pertinent family history.  Social History Social History   Tobacco Use  . Smoking status: Never Smoker  . Smokeless tobacco: Never Used  Substance Use Topics  . Alcohol use: No  . Drug use: No     Allergies   Cetirizine   Review of Systems Review of Systems  Constitutional: Negative for fever.  HENT: Positive for ear pain.   Allergic/Immunologic: Positive for environmental allergies.     Physical Exam Updated Vital Signs BP (!) 143/90 (BP Location: Left Arm)   Pulse 70   Temp 98.2 F (36.8 C) (Oral)   Resp 18   Ht 5\' 9"  (1.753 m)   Wt (!) 142.9 kg   LMP 03/24/2018   SpO2 100%   BMI 46.52 kg/m   Physical Exam  Constitutional: She is oriented to person, place, and time. She  appears well-developed and well-nourished. No distress.  HENT:  Head: Normocephalic and atraumatic.  Right Ear: Hearing, tympanic membrane, external ear and ear canal normal.  Left Ear: Hearing, tympanic membrane, external ear and ear canal normal.  Nose: Nose normal.  Mouth/Throat: Uvula is midline, oropharynx is clear and moist and mucous membranes are normal.  Eyes: Pupils are equal, round, and reactive to light. Conjunctivae are normal. Right eye exhibits no discharge. Left eye exhibits no discharge. No scleral icterus.  Neck: Normal range of motion.  Cardiovascular: Normal rate and regular rhythm.  Pulmonary/Chest: Effort normal. No respiratory distress.  Abdominal: She exhibits no distension.  Neurological: She is alert and oriented to person, place, and time.  Skin: Skin is warm and dry.  Psychiatric: She has a normal mood and affect. Her behavior is normal.  Nursing note and vitals reviewed.    ED Treatments / Results    Labs (all labs ordered are listed, but only abnormal results are displayed) Labs Reviewed - No data to display  EKG None  Radiology No results found.  Procedures Procedures (including critical care time)  Medications Ordered in ED Medications - No data to display   Initial Impression / Assessment and Plan / ED Course  I have reviewed the triage vital signs and the nursing notes.  Pertinent labs & imaging results that were available during my care of the patient were reviewed by me and considered in my medical decision making (see chart for details).  22 year old female presents with R ear pain and fullness for three days. She is mildly hypertensive but otherwise vitals are normal. No obvious ear infection, wax build up, or fluid behind TM on exam. Discussed with pt. Advised to try Benadryl and return if worsening  Final Clinical Impressions(s) / ED Diagnoses   Final diagnoses:  Otalgia of right ear    ED Discharge Orders    None       Bethel Born, PA-C 03/31/18 8413    Devoria Albe, MD 03/31/18 [phone]

## 2018-03-31 NOTE — Discharge Instructions (Signed)
Please try Benadryl to see if this helps with your symptoms Return if you are worsening

## 2018-04-17 ENCOUNTER — Encounter (HOSPITAL_COMMUNITY): Payer: Self-pay | Admitting: Emergency Medicine

## 2018-04-17 ENCOUNTER — Emergency Department (HOSPITAL_COMMUNITY)
Admission: EM | Admit: 2018-04-17 | Discharge: 2018-04-17 | Disposition: A | Discharge status: Home / Self Care | Payer: BLUE CROSS/BLUE SHIELD | Attending: Emergency Medicine | Admitting: Emergency Medicine

## 2018-04-17 ENCOUNTER — Other Ambulatory Visit: Payer: Self-pay

## 2018-04-17 DIAGNOSIS — T781XXA Other adverse food reactions, not elsewhere classified, initial encounter: Secondary | ICD-10-CM

## 2018-04-17 DIAGNOSIS — J45909 Unspecified asthma, uncomplicated: Secondary | ICD-10-CM | POA: Diagnosis not present

## 2018-04-17 DIAGNOSIS — L509 Urticaria, unspecified: Secondary | ICD-10-CM | POA: Insufficient documentation

## 2018-04-17 DIAGNOSIS — T61781A Other shellfish poisoning, accidental (unintentional), initial encounter: Secondary | ICD-10-CM

## 2018-04-17 DIAGNOSIS — R220 Localized swelling, mass and lump, head: Secondary | ICD-10-CM | POA: Diagnosis present

## 2018-04-17 MED ORDER — DIPHENHYDRAMINE HCL 25 MG PO TABS: ORAL_TABLET | ORAL | 0 refills | Status: DC

## 2018-04-17 MED ORDER — DIPHENHYDRAMINE HCL 25 MG PO CAPS
50.0000 mg | ORAL_CAPSULE | Freq: Once | ORAL | Status: AC
  Administered 2018-04-17: 50 mg via ORAL
  Filled 2018-04-17: qty 2

## 2018-04-17 MED ORDER — DEXAMETHASONE 4 MG PO TABS
10.0000 mg | ORAL_TABLET | Freq: Once | ORAL | Status: AC
  Administered 2018-04-17: 10 mg via ORAL
  Filled 2018-04-17: qty 3

## 2018-04-17 MED ORDER — RANITIDINE HCL 150 MG/10ML PO SYRP: 150.0000 mg | ORAL_SOLUTION | Freq: Once | ORAL | Status: DC

## 2018-04-17 MED ORDER — FAMOTIDINE 20 MG PO TABS
20.0000 mg | ORAL_TABLET | Freq: Once | ORAL | Status: AC
  Administered 2018-04-17: 20 mg via ORAL
  Filled 2018-04-17: qty 1

## 2018-04-17 MED ORDER — EPINEPHRINE 0.3 MG/0.3ML IJ SOAJ: 0.3000 mg | Freq: Once | INTRAMUSCULAR | 0 refills | Status: AC | PRN

## 2018-04-17 MED ORDER — RANITIDINE HCL 150 MG PO CAPS: 150.0000 mg | ORAL_CAPSULE | Freq: Every day | ORAL | 0 refills | Status: AC

## 2018-04-17 NOTE — ED Provider Notes (Signed)
[facility] EMERGENCY DEPARTMENT Provider Note   CSN: [id number] Arrival date & time: 04/17/18  0242     History   Chief Complaint Chief Complaint  Patient presents with  . Allergic Reaction    HPI Tonya Krueger is a 22 y.o. female.  HPI 22 year old female here with lip swelling and rash.  The patient states that her symptoms started around 8:00 last night.  She ate sushi earlier in the day and had crab sticks.  She states that she has a history of recurrent allergic reactions with hives and lip swelling.  No tongue swelling.  She was [facility] with [facility]fish [facility].  She states that earlier today, she noticed mild swelling of her lips.  This then resolved.  However, just 1 to 2 hours ago, she developed recurrence of lip swelling along with diffuse hives.  She describes hives as itchy, not painful.  Denies any tongue swelling.  No difficulty swallowing.  No wheezing or shortness of breath.  She has never had to use an EpiPen.  She has not taken anything today.   Past Medical History:  Diagnosis Date  . Asthma   . History of hip surgery     There are no active problems to display for this patient.   Past Surgical History:  Procedure Laterality Date  . hip surgery       OB History    Gravida  0   Para      Term      Preterm      AB      Living        SAB      TAB      Ectopic      Multiple      Live Births               Home Medications    Prior to Admission medications   Medication Sig Start Date End Date Taking? Authorizing Provider  albuterol (PROVENTIL) (5 MG/ML) 0.5% nebulizer solution Take 0.5 mLs (2.5 mg total) by nebulization every 6 (six) hours as needed for wheezing or shortness of breath. 02/17/17   Antony Madura, PA-C  benzonatate (TESSALON) 100 MG capsule Take 1 capsule (100 mg total) by mouth 3 (three) times daily as needed for cough. 02/17/17   Antony Madura, PA-C  diphenhydrAMINE  (BENADRYL) 25 MG tablet Take one tablet every 6 hours for 24 hours, then as needed for itching. 04/17/18   Shaune Pollack, MD  EPINEPHrine 0.3 mg/0.3 mL IJ SOAJ injection Inject 0.3 mLs (0.3 mg total) into the muscle once as needed for up to 1 dose (severe tongue swelling, shortness of breath, anaphylaxis). 04/17/18   Shaune Pollack, MD  ibuprofen (ADVIL,MOTRIN) 600 MG tablet Take 1 tablet (600 mg total) by mouth every 6 (six) hours as needed for headache, mild pain or moderate pain. 02/17/17   Antony Madura, PA-C  magic mouthwash w/lidocaine SOLN Take 5 mLs by mouth 4 (four) times daily as needed (sore throat). 02/17/17   Antony Madura, PA-C  mometasone (NASONEX) 50 MCG/ACT nasal spray Place 2 sprays into the nose daily. 02/17/17   Antony Madura, PA-C  ranitidine (ZANTAC) 150 MG capsule Take 1 capsule (150 mg total) by mouth daily for 14 days. 04/17/18 05/01/18  Shaune Pollack, MD    Family History No family history on file.  Social History Social History   Tobacco Use  . Smoking status: Never Smoker  . Smokeless  tobacco: Never Used  Substance Use Topics  . Alcohol use: No  . Drug use: No     Allergies   Cetirizine   Review of Systems Review of Systems  Constitutional: Negative for chills, fatigue and fever.  HENT: Positive for facial swelling. Negative for congestion and rhinorrhea.   Eyes: Negative for visual disturbance.  Respiratory: Negative for cough, shortness of breath and wheezing.   Cardiovascular: Negative for chest pain and leg swelling.  Gastrointestinal: Negative for abdominal pain, diarrhea, nausea and vomiting.  Genitourinary: Negative for dysuria and flank pain.  Musculoskeletal: Negative for neck pain and neck stiffness.  Skin: Positive for rash. Negative for wound.  Allergic/Immunologic: Negative for immunocompromised state.  Neurological: Negative for syncope, weakness and headaches.  All other systems reviewed and are negative.    Physical Exam Updated  Vital Signs BP 137/60 (BP Location: Right Arm)   Pulse 94   Temp 99.7 F (37.6 C) (Oral)   Resp 18   LMP 03/20/2018   SpO2 100%   Physical Exam  Constitutional: She is oriented to person, place, and time. She appears well-developed and well-nourished. No distress.  HENT:  Head: Normocephalic and atraumatic.  No appreciable lip swelling or edema.  No tongue swelling.  Oropharynx widely patent.  No uvular swelling.  Eyes: Conjunctivae are normal.  Neck: Neck supple.  Cardiovascular: Normal rate, regular rhythm and normal heart sounds. Exam reveals no friction rub.  No murmur heard. Pulmonary/Chest: Effort normal and breath sounds normal. No respiratory distress. She has no wheezes. She has no rales.  Abdominal: She exhibits no distension.  Musculoskeletal: She exhibits no edema.  Neurological: She is alert and oriented to person, place, and time. She exhibits normal muscle tone.  Skin: Skin is warm. Capillary refill takes less than 2 seconds.  Sparse, scattered, urticaria throughout the bilateral upper extremities and trunk and back.  Psychiatric: She has a normal mood and affect.  Nursing note and vitals reviewed.    ED Treatments / Results  Labs (all labs ordered are listed, but only abnormal results are displayed) Labs Reviewed - No data to display  EKG None  Radiology No results found.  Procedures Procedures (including critical care time)  Medications Ordered in ED Medications  diphenhydrAMINE (BENADRYL) capsule 50 mg (50 mg Oral Given 04/17/18 0521)  dexamethasone (DECADRON) tablet 10 mg (10 mg Oral Given 04/17/18 0519)  famotidine (PEPCID) tablet 20 mg (20 mg Oral Given 04/17/18 0520)     Initial Impression / Assessment and Plan / ED Course  I have reviewed the triage vital signs and the nursing notes.  Pertinent labs & imaging results that were available during my care of the patient were reviewed by me and considered in my medical decision making (see chart  for details).     22 year old female here with urticaria and lip swelling.  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  This was just recently diagnosed and she did not realize she could react crab.  Clinically, she has mild urticaria only.  No evidence of anaphylaxis or second system involvement.  Patient given a dose of Decadron as well as Zantac and Benadryl here, with improvement.  Given her stable vital signs, well appearance, no signs of airway compromise or anaphylaxis, will discharge with outpatient course of antihistamines.  EpiPen prescribed.  Advised her to follow-up with her allergy specialist for further testing.  Final Clinical Impressions(s) / ED Diagnoses   Final diagnoses:  Urticaria  Allergic reaction to  shellfish    ED Discharge Orders         Ordered    ranitidine (ZANTAC) 150 MG capsule  Daily     04/17/18 0529    EPINEPHrine 0.3 mg/0.3 mL IJ SOAJ injection  Once PRN     04/17/18 0529    diphenhydrAMINE (BENADRYL) 25 MG tablet     04/17/18 0529           Shaune Pollack, MD 04/17/18 0602

## 2018-04-17 NOTE — ED Triage Notes (Signed)
C/o lip swelling x 2 hours and hives to upper body x 1 hour.  Also reports throat itchy but denies SOB.  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  States she ate [employer] chicken nuggets for dinner and crab earlier today but doesn't think it is related.

## 2018-04-17 NOTE — Discharge Instructions (Signed)
For your allergic reaction:  Take the benadryl as needed. Take Zantac daily, as prescribed (this is an antihistamine, similar to benadryl) You were given a dose of steroid here I've also prescribed an Epipen - keep this on you at all times  Do not eat ANY shellfish - shrimp, lobster, crab, crawfish, etc.  Follow-up with an allergy specialist in 1-2 weeks

## 2018-07-08 ENCOUNTER — Other Ambulatory Visit: Payer: Self-pay

## 2018-07-08 ENCOUNTER — Emergency Department (HOSPITAL_COMMUNITY)
Admission: EM | Admit: 2018-07-08 | Discharge: 2018-07-08 | Disposition: A | Discharge status: Home / Self Care | Payer: PRIVATE HEALTH INSURANCE | Attending: Emergency Medicine | Admitting: Emergency Medicine

## 2018-07-08 ENCOUNTER — Encounter (HOSPITAL_COMMUNITY): Payer: Self-pay | Admitting: Obstetrics and Gynecology

## 2018-07-08 DIAGNOSIS — J069 Acute upper respiratory infection, unspecified: Secondary | ICD-10-CM | POA: Insufficient documentation

## 2018-07-08 DIAGNOSIS — J45909 Unspecified asthma, uncomplicated: Secondary | ICD-10-CM | POA: Insufficient documentation

## 2018-07-08 NOTE — ED Provider Notes (Signed)
[facility] Note   CSN: [id number] Arrival date & time: 07/08/18  0026     History   Chief Complaint Chief Complaint  Patient presents with  . Chills  . Nasal Congestion  . Cough    HPI Tonya Krueger is a 23 y.o. female.  Patient to ED with complaint of congestion, cough and chills without fever. Symptoms started 3 days ago and have not changed. She reports exposure to the flu and she is concerned she has contracted it. No nausea or vomiting. No diarrhea. She has a "scratchy" throat, normal PO intake.   The history is provided by the patient. No language interpreter was used.  Cough  Associated symptoms include rhinorrhea. Pertinent negatives include no chills and no myalgias.    Past Medical History:  Diagnosis Date  . Asthma   . History of hip surgery     There are no active problems to display for this patient.   Past Surgical History:  Procedure Laterality Date  . hip surgery       OB History    Gravida  0   Para      Term      Preterm      AB      Living        SAB      TAB      Ectopic      Multiple      Live Births               Home Medications    Prior to Admission medications   Medication Sig Start Date End Date Taking? Authorizing Provider  albuterol (PROVENTIL) (5 MG/ML) 0.5% nebulizer solution Take 0.5 mLs (2.5 mg total) by nebulization every 6 (six) hours as needed for wheezing or shortness of breath. 02/17/17   Antony Madura, PA-C  benzonatate (TESSALON) 100 MG capsule Take 1 capsule (100 mg total) by mouth 3 (three) times daily as needed for cough. 02/17/17   Antony Madura, PA-C  diphenhydrAMINE (BENADRYL) 25 MG tablet Take one tablet every 6 hours for 24 hours, then as needed for itching. 04/17/18   Shaune Pollack, MD  EPINEPHrine 0.3 mg/0.3 mL IJ SOAJ injection Inject 0.3 mLs (0.3 mg total) into the muscle once as needed for up to 1 dose (severe tongue swelling, shortness of breath,  anaphylaxis). 04/17/18   Shaune Pollack, MD  ibuprofen (ADVIL,MOTRIN) 600 MG tablet Take 1 tablet (600 mg total) by mouth every 6 (six) hours as needed for headache, mild pain or moderate pain. 02/17/17   Antony Madura, PA-C  magic mouthwash w/lidocaine SOLN Take 5 mLs by mouth 4 (four) times daily as needed (sore throat). 02/17/17   Antony Madura, PA-C  mometasone (NASONEX) 50 MCG/ACT nasal spray Place 2 sprays into the nose daily. 02/17/17   Antony Madura, PA-C  ranitidine (ZANTAC) 150 MG capsule Take 1 capsule (150 mg total) by mouth daily for 14 days. 04/17/18 05/01/18  Shaune Pollack, MD    Family History No family history on file.  Social History Social History   Tobacco Use  . Smoking status: Never Smoker  . Smokeless tobacco: Never Used  Substance Use Topics  . Alcohol use: No  . Drug use: No     Allergies   Cetirizine   Review of Systems Review of Systems  Constitutional: Negative for chills and fever.  HENT: Positive for congestion and rhinorrhea. Negative for trouble swallowing.   Respiratory: Positive for cough.  Cardiovascular: Negative.   Gastrointestinal: Negative.  Negative for abdominal pain, nausea and vomiting.  Musculoskeletal: Negative.  Negative for myalgias.  Skin: Negative.   Neurological: Negative.      Physical Exam Updated Vital Signs BP (!) 152/104 (BP Location: Left Arm)   Pulse 86   Temp 98.8 F (37.1 C)   Resp 18   Ht 5\' 8"  (1.727 m)   Wt (!) 140.6 kg   LMP 06/07/2018   SpO2 98%   BMI 47.14 kg/m   Physical Exam Vitals signs and nursing note reviewed.  Constitutional:      Appearance: She is well-developed.  HENT:     Head: Normocephalic.  Neck:     Musculoskeletal: Normal range of motion and neck supple.  Cardiovascular:     Rate and Rhythm: Normal rate and regular rhythm.  Pulmonary:     Effort: Pulmonary effort is normal.     Breath sounds: Normal breath sounds.  Abdominal:     General: Bowel sounds are normal.      Palpations: Abdomen is soft.     Tenderness: There is no abdominal tenderness. There is no guarding or rebound.  Musculoskeletal: Normal range of motion.  Skin:    General: Skin is warm and dry.     Findings: No rash.  Neurological:     General: No focal deficit present.     Mental Status: She is alert and oriented to person, place, and time.      ED Treatments / Results  Labs (all labs ordered are listed, but only abnormal results are displayed) Labs Reviewed - No data to display  EKG None  Radiology No results found.  Procedures Procedures (including critical care time)  Medications Ordered in ED Medications - No data to display   Initial Impression / Assessment and Plan / ED Course  I have reviewed the triage vital signs and the nursing notes.  Pertinent labs & imaging results that were available during my care of the patient were reviewed by me and considered in my medical decision making (see chart for details).     Patient to ED with URI symptoms x 3 days without fever. Exposure to flu which concerned the patient.   She is well appearing. Without fever, doubt influenza, however, she is outside any Tamiflu window after 3 days. Recommended supportive care.   Final Clinical Impressions(s) / ED Diagnoses   Final diagnoses:  None   1. URI  ED Discharge Orders    None       Elpidio AnisUpstill, Ramadan Couey, PA-C 07/08/18 0231    Ward, Layla MawKristen N, DO 07/08/18 [phone]

## 2018-07-08 NOTE — ED Triage Notes (Signed)
Pt reports she has a cough, nasal congestion, and chills. Pt reports some people close to her have had the flu and she is concerned she has the flu.  Pt denies n/v

## 2018-07-08 NOTE — Discharge Instructions (Addendum)
Push fluids. Take Tylenol and/or ibuprofen for aches and if any fever develops. Rest. Return to the ED with any worsening symptoms - high fever, severe pain, vomiting, new concern. Otherwise, follow up with your doctor for recheck if symptoms persist.

## 2018-07-10 ENCOUNTER — Other Ambulatory Visit: Payer: Self-pay

## 2018-07-10 ENCOUNTER — Encounter (HOSPITAL_COMMUNITY): Payer: Self-pay

## 2018-07-10 ENCOUNTER — Emergency Department (HOSPITAL_COMMUNITY)
Admission: EM | Admit: 2018-07-10 | Discharge: 2018-07-10 | Disposition: A | Discharge status: Home / Self Care | Payer: Self-pay | Attending: Emergency Medicine | Admitting: Emergency Medicine

## 2018-07-10 DIAGNOSIS — Z5321 Procedure and treatment not carried out due to patient leaving prior to being seen by health care provider: Secondary | ICD-10-CM | POA: Insufficient documentation

## 2018-07-10 DIAGNOSIS — R070 Pain in throat: Secondary | ICD-10-CM | POA: Insufficient documentation

## 2018-07-10 LAB — GROUP A STREP BY PCR: Group A Strep by PCR: NOT DETECTED

## 2018-07-10 NOTE — ED Notes (Signed)
No answer in WR

## 2018-07-10 NOTE — ED Triage Notes (Signed)
Pt reports sore throat, body aches, cough and nasal congestion. Pt reports that she was seen Wednesday night and told if it got worse to come back

## 2018-07-10 NOTE — ED Notes (Signed)
No answer when called for a room. 

## 2019-02-24 ENCOUNTER — Emergency Department (HOSPITAL_COMMUNITY)
Admission: EM | Admit: 2019-02-24 | Discharge: 2019-02-24 | Discharge status: Against Medical Advice | Payer: Self-pay | Attending: Emergency Medicine | Admitting: Emergency Medicine

## 2022-11-27 ENCOUNTER — Encounter (HOSPITAL_BASED_OUTPATIENT_CLINIC_OR_DEPARTMENT_OTHER): Payer: Self-pay

## 2022-11-27 ENCOUNTER — Telehealth (HOSPITAL_BASED_OUTPATIENT_CLINIC_OR_DEPARTMENT_OTHER): Payer: Self-pay

## 2022-11-27 NOTE — Telephone Encounter (Signed)
Left a voicemail for patient to call the office and schedule bariatric consult with Dr.Moazzez per referral received in office.

## 2022-12-03 ENCOUNTER — Telehealth (HOSPITAL_BASED_OUTPATIENT_CLINIC_OR_DEPARTMENT_OTHER): Payer: Self-pay | Admitting: Surgery

## 2022-12-03 NOTE — Telephone Encounter (Signed)
BENEFIT DETERMINATION ENCOUNTER WITH INSURANCE    Insurance Disclaimer: "A quote of benefits and/or authorization does not guarantee payment or verify eligibility. Payment of benefits are subject to all terms, conditions, limitations, and exclusions of the member's contract at time of service."    REFERENCE NUMBER:  [id number] REPRESENTATIVE:  EDDIE G     PRIMARY:  [employer] PPO    SURGEON: MO OFFICE:  FO DATE OF CONSULT:    12/10/2022     MEMBER INFORMATION:  Laura Berger  [employer] [facility]   INSURANCE ID [id number]   SUBSCRIBER NAME LOREA, BRESTER   DOB 23-Aug-1995   STATUS ACTIVE   RENEWS 12/06/2022     EMPLOYER         IN NETWORK STATUS OFFICE?  Y      IN NETWORK STATUS PROVIDER? Y     LIMIT  ACCUMILATION  MET/NOT MET    INDIVIDUAL DEDUCTIBLE       INDIVIDUAL OUT OF POCKET       FAMILY DEDUCTIBLE       FAMILY OUT OF POCKET         ______________________________________________________________________    BENEFITS YES/NO    MORBID OBESITY YES           Specialist Copay: 40.00 Deductibles:  YES Co-Insurance:           ____________________________________________________________________  BARIATRIC SURGERY COVERAGE DETAILS FOR INPATIENT  [phone]  DX: E66.01   CONTRACT  GUIDELINES/EXCLUSIONS:  NON NOTED    OTHER INDICATED CRITERIA/GUIDELINES:   Clinical Indications  Medically Necessary:    Gastric bypass and gastric restrictive procedures are considered medically necessary when all of the following criteria are met:    Individual is age 46 years or older; and  The recommended surgery is one of the following procedures:  Biliopancreatic bypass with duodenal switch  Laparoscopic adjustable gastric banding  Roux-en-Y procedure up to 150 cm  Sleeve gastrectomy  Vertical banded gastroplasty; and  A body mass index (BMI) of 40 or greater, or BMI of 35 or greater with an obesity-related co-morbid condition including, but not limited to:  Diabetes mellitus  Cardiovascular disease  Hypertension  Life threatening  cardio-pulmonary problems, (for example, severe obstructive sleep apnea, Pickwickian syndrome, obesity related cardiomyopathy); and  Documentation of all of the following:  Past participation in a weight loss program; and  Inadequate weight loss despite a committed attempt at conservative medical therapy (for example, comprehensive lifestyle interventions, including a combination of diet, exercise, and behavioral modifications); and  Pre-operative medical and mental health evaluations and clearances; and  Pre-operative education which addresses the risks, benefits, realistic expectations and the need for long-term follow-up and adherence to behavioral modifications; and  A treatment plan which addresses the pre- and post-operative needs of an individual undergoing bariatric surgery.  Reoperation    Surgical repair/correction or reversal following gastric bypass and gastric restrictive procedures is considered medically necessary when there is documentation of a surgical complication related to the original surgery, such as a fistula, obstruction, erosion, disruption/leakage of a suture/staple line, band herniation, stricture, documented gastroesophageal reflux disease (GERD) or pouch enlargement/dilation.    Surgical revision/conversion to another surgical procedure* is considered medically necessary when either criteria A or B are met:    For inadequate weight loss or weight gain 1 year or longer after a prior procedure, all the following criteria are met:  BMI of 40 or greater; or  BMI of 35 or greater with an obesity-related  co-morbid condition, including but not limited to:  Diabetes mellitus; or  Cardiovascular disease; or  Hypertension; or  Life threatening cardio-pulmonary problems, (for example, severe obstructive sleep apnea, Pickwickian syndrome, obesity related cardiomyopathy);  and  Pre-operative medical and mental health evaluations and clearances; and  Pre-operative education which addresses the risks,  benefits, realistic expectations and the need for long-term follow-up and adherence to behavioral modifications; and  A treatment plan which addresses the pre- and post-operative needs of an individual undergoing bariatric surgery.  There is documentation of a complication related to the initial procedure (including but not limited to, obstruction, stricture or documented GERD).  * Revision/ conversion indications apply to the procedures listed under criteria B for the initial procedure.    Not Medically Necessary:    Initial and reoperative bariatric procedures are considered not medically necessary when the criteria listed above are not met.    Bariatric surgical procedures including, but not limited to, laparoscopic adjustable gastric banding are considered not medically necessary for individuals with a BMI below 35 kg/m.    All other gastric bypass/restrictive procedures and other treatment modalities are considered not medically necessary including, but not limited to the following:    One anastomosis gastric bypass, also known as mini gastric bypass;  Malabsorptive procedures including, but not limited to, jejunoileal bypass, biliopancreatic bypass without duodenal switch, single anastomosis duodenal switch or very long limb (greater than 150 cm) gastric bypass (other than the biliopancreatic bypass with duodenal switch);  Minimally invasive endoluminal gastric restrictive surgical techniques, such as use of the EndoGastric StomaphyXT endoluminal fastener and delivery system or endoscopic sleeve gastroplasty;  Laparoscopic gastric plication (laparoscopic greater curvature plication [LGCP]) with or without gastric banding;  Balloon systems, (such as the [place] Intragastic Balloon System or the TransPyloric Shuttle);  Vagus (or vagal) nerve blocking devices;  Endoscopically placed percutaneous aspiration tube (such as AspireAssist);  Bariatric arterial embolization.  Further Consideration:    A bariatric  surgeon with experience in the pediatric population may request further consideration of a case of an individual under 54 years old with severe morbid obesity and unique circumstances by contacting a Wellsite geologist. For further information, see Rationale section Bariatric Surgery in Adolescents and Children    PRIOR AUTHORIZATION REQUIRED:  YES    PRIOR AUTH CONTACT INFORMATION:     NUTRITION PROGRAM REQUIREMENT: 3 MONTHS/ PSYCH EVAL/ MED CLEARANCE     6 MONTHS NICOTINE FREE REQUIREMENT FOR BCBS CAPITAL BLUE, GEHA, FED BCBS    BRS ENROLLEMENT REQUIRED: Tel:[phone] (89-Month Program Required): n/a    AHP ENROLLEMENT REQUIRED:Tel: [phone] (27-Month Program Required): YES, 12 MONTHS    INPATIENT COPAY: 0.00    CO-INSURANCE:  100%    DEDUCTIBLE:  DOES NOT APPLY    _______________________________________________________________________  NUTRITION COUNSELING BENEFITS    Nutrition Counseling Benefits? YES Nutrition Copayment: 0.00 Co-Insurance:  100%     Visits: BASED ON MEDICAL NECESSITY  Used:       Deductible:  DOES NOT APPLY       [id number]  (MEDICAL NUTRITION INITIAL INDIVIDUAL) YES E66.01(MORBID OBESITY)  EGD: YES  PRIOR AUTH: NO YES   [id number]  (MEDICAL NUTRITION, SUBSEQUENT, INDIVIDUAL) YES E66.9(BMI CLASS II W/CO-MORBIDITY)    [id number]  (MEDICAL NUTRITION GROUP) YES Z71.3(DIETARY COUNSELING)    [phone]  (MEDICAL NUTRITION INITIAL, INDIVIDUAL, RE-ASSESSMENT)  Z98.84(S/P) BARIATRIC SURGERY    [id number]  (MEDICAL NUTRITION GROUP, RE-ASSESSMENT)  Prior auth required NO   [id number]  (GASTRID BAND ADJUSTMENT: DX: Z46.51)  TEL:  FAX:    ________________________________________________________________________  TELEHEALTH BENEFITS INCLUDING VIRTUAL/PHONE ENCOUNTERS       Benefits? YES  [email or web address] Co-Insurance:      Deductible:

## 2022-12-10 ENCOUNTER — Institutional Professional Consult (permissible substitution) (HOSPITAL_BASED_OUTPATIENT_CLINIC_OR_DEPARTMENT_OTHER): Payer: BC Managed Care – PPO | Admitting: Surgery

## 2023-02-10 LAB — HEPATITIS B (HBV) SURFACE ANTIGEN WITH REFLEX TO CONFIRMATION: Hepatitis B Surface Antigen: NEGATIVE

## 2023-02-10 LAB — HIV-1/2, ANTIGEN AND ANTIBODY WITH REFLEX TO CONFIRMATION: HIV Ag/Ab, 4th Generation: NEGATIVE

## 2023-02-10 LAB — RUBELLA ANTIBODY, IGG: Rubella AB, IgG: IMMUNE

## 2023-02-21 ENCOUNTER — Inpatient Hospital Stay (HOSPITAL_COMMUNITY)
Admission: EM | Admit: 2023-02-21 | Discharge: 2023-02-22 | Disposition: A | Discharge status: Home / Self Care | Payer: Medicaid Other | Attending: Obstetrics and Gynecology | Admitting: Obstetrics and Gynecology

## 2023-02-21 ENCOUNTER — Encounter (HOSPITAL_COMMUNITY): Payer: Self-pay

## 2023-02-21 ENCOUNTER — Other Ambulatory Visit: Payer: Self-pay

## 2023-02-21 DIAGNOSIS — O9A212 Injury, poisoning and certain other consequences of external causes complicating pregnancy, second trimester: Secondary | ICD-10-CM

## 2023-02-21 DIAGNOSIS — Z6790 Unspecified blood type, Rh positive: Secondary | ICD-10-CM

## 2023-02-21 DIAGNOSIS — Z3A14 14 weeks gestation of pregnancy: Secondary | ICD-10-CM | POA: Insufficient documentation

## 2023-02-21 DIAGNOSIS — M1711 Unilateral primary osteoarthritis, right knee: Secondary | ICD-10-CM | POA: Insufficient documentation

## 2023-02-21 DIAGNOSIS — O10911 Unspecified pre-existing hypertension complicating pregnancy, first trimester: Secondary | ICD-10-CM

## 2023-02-21 DIAGNOSIS — W010XXA Fall on same level from slipping, tripping and stumbling without subsequent striking against object, initial encounter: Secondary | ICD-10-CM | POA: Insufficient documentation

## 2023-02-21 HISTORY — DX: Essential (primary) hypertension: I10

## 2023-02-21 NOTE — ED Notes (Signed)
This RN called and spoke with Dr. Nicanor Alcon about this patient's complaints. She verbalized to place orders for xrays. No other verbal orders at this time.

## 2023-02-21 NOTE — ED Triage Notes (Signed)
Pt reports she is [redacted] weeks pregnant, states she slipped on a wet floor and landed on her right wrist and right knee and is reporting pain there as well as her pelvis. Denies vaginal bleeding or leakage of any fluids. No Loc, no head injury.

## 2023-02-22 ENCOUNTER — Emergency Department (HOSPITAL_COMMUNITY): Payer: Medicaid Other

## 2023-02-22 ENCOUNTER — Inpatient Hospital Stay (HOSPITAL_COMMUNITY): Payer: Medicaid Other

## 2023-02-22 ENCOUNTER — Other Ambulatory Visit: Payer: Self-pay | Admitting: Advanced Practice Midwife

## 2023-02-22 ENCOUNTER — Encounter (HOSPITAL_COMMUNITY): Payer: Self-pay | Admitting: Obstetrics and Gynecology

## 2023-02-22 DIAGNOSIS — W010XXA Fall on same level from slipping, tripping and stumbling without subsequent striking against object, initial encounter: Secondary | ICD-10-CM

## 2023-02-22 DIAGNOSIS — O9A212 Injury, poisoning and certain other consequences of external causes complicating pregnancy, second trimester: Secondary | ICD-10-CM

## 2023-02-22 DIAGNOSIS — Z3A14 14 weeks gestation of pregnancy: Secondary | ICD-10-CM | POA: Diagnosis not present

## 2023-02-22 DIAGNOSIS — M1711 Unilateral primary osteoarthritis, right knee: Secondary | ICD-10-CM | POA: Diagnosis not present

## 2023-02-22 LAB — ABO/RH: ABO/RH(D): B POS

## 2023-02-22 MED ORDER — CYCLOBENZAPRINE HCL 5 MG PO TABS
10.0000 mg | ORAL_TABLET | Freq: Once | ORAL | Status: AC
  Administered 2023-02-22: 10 mg via ORAL
  Filled 2023-02-22: qty 2

## 2023-02-22 MED ORDER — CYCLOBENZAPRINE HCL 10 MG PO TABS: 10.0000 mg | ORAL_TABLET | Freq: Three times a day (TID) | ORAL | 0 refills | Status: AC | PRN

## 2023-02-22 MED ORDER — CYCLOBENZAPRINE HCL 10 MG PO TABS: 10.0000 mg | ORAL_TABLET | Freq: Three times a day (TID) | ORAL | 0 refills | Status: DC | PRN

## 2023-02-22 NOTE — MAU Note (Signed)
..  Tonya Krueger is a 27 y.o. at [redacted]w[redacted]d here in MAU reporting: slipped on wet floor and fell on her knee then on her bottom, used her hand to hold her up.  Reports pain on her wrist and pelvis. Reports pain in her pelvis when she is walking and when she is sitting for long periods.   Takes Procardia for [facility] Daily last dose was this morning.  Pain score: 5/10 wrist; 7/10 pelvis Vitals:   02/21/23 2353 02/22/23 0105  BP: (!) 159/105 (!) 164/89  Pulse: (!) 117 94  Resp: 18 19  Temp: 99.6 F (37.6 C) 99.6 F (37.6 C)  SpO2: 100% 100%     YQM:VHQION to doppler in triage  Lab orders placed from triage: UA

## 2023-02-22 NOTE — MAU Provider Note (Signed)
Chief Complaint: Fall   None     SUBJECTIVE HPI: Tonya Krueger is a 27 y.o. G1P0 at [redacted]w[redacted]d who presents to maternity admissions reporting she slipped on a wet floor tonight, hit her right knee and right wrist and then her buttocks. She did not hit her abdomen.  She reports pain in her wrist and pelvis. Pelvic pain is intermittent, after walking or standing for long period.    She denies vaginal bleeding or LOF.    HPI  Past Medical History:  Diagnosis Date   Asthma    History of hip surgery    Hypertension    Past Surgical History:  Procedure Laterality Date   hip surgery     Social History   Socioeconomic History   Marital status: Single    Spouse name: Not on file   Number of children: Not on file   Years of education: Not on file   Highest education level: Not on file  Occupational History   Not on file  Tobacco Use   Smoking status: Never   Smokeless tobacco: Never  Vaping Use   Vaping status: Never Used  Substance and Sexual Activity   Alcohol use: No   Drug use: No   Sexual activity: Never    Birth control/protection: None  Other Topics Concern   Not on file  Social History Narrative   Not on file   Social Determinants of Health   Financial Resource Strain: Not on file  Food Insecurity: Not on file  Transportation Needs: Not on file  Physical Activity: Not on file  Stress: Not on file (08/16/2019)  Social Connections: Not on file  Intimate Partner Violence: Low Risk  (10/21/2019)   [facility]   Intimate Partner Violence    Insults You: Not on file    Threatens You: Not on file    Screams at You: Not on file    Physically Hurt: Not on file    Intimate Partner Violence Score: Not on file   No current facility-administered medications on file prior to encounter.   Current Outpatient Medications on File Prior to Encounter  Medication Sig Dispense Refill   albuterol (PROVENTIL) (5 MG/ML) 0.5% nebulizer solution Take 0.5 mLs (2.5 mg  total) by nebulization every 6 (six) hours as needed for wheezing or shortness of breath. 20 mL 12   EPINEPHrine 0.3 mg/0.3 mL IJ SOAJ injection Inject 0.3 mLs (0.3 mg total) into the muscle once as needed for up to 1 dose (severe tongue swelling, shortness of breath, anaphylaxis). 1 Device 0   ranitidine (ZANTAC) 150 MG capsule Take 1 capsule (150 mg total) by mouth daily for 14 days. 14 capsule 0   Allergies  Allergen Reactions   Cetirizine Anaphylaxis    Itching and swelling    ROS:  Review of Systems  Constitutional:  Negative for chills, fatigue and fever.  Respiratory:  Negative for shortness of breath.   Cardiovascular:  Negative for chest pain.  Gastrointestinal:  Negative for abdominal pain.  Genitourinary:  Positive for pelvic pain. Negative for difficulty urinating, dysuria, flank pain, vaginal bleeding, vaginal discharge and vaginal pain.  Musculoskeletal:  Positive for arthralgias.  Neurological:  Negative for dizziness and headaches.  Psychiatric/Behavioral: Negative.       I have reviewed patient's Past Medical Hx, Surgical Hx, Family Hx, Social Hx, medications and allergies.   Physical Exam  Patient Vitals for the past 24 hrs:  BP Temp Temp src Pulse Resp SpO2 Height  Weight  02/22/23 0302 (!) 149/88 97.8 F (36.6 C) Oral 98 20 100 % -- --  02/22/23 0146 135/72 -- -- (!) 103 -- -- -- --  02/22/23 0143 128/64 -- -- 95 -- -- -- --  02/22/23 0105 (!) 164/89 99.6 F (37.6 C) Oral 94 19 100 % 5\' 7"  (1.702 m) (!) 173.7 kg  02/21/23 2353 (!) 159/105 99.6 F (37.6 C) Oral (!) 117 18 100 % -- --  02/21/23 2353 -- -- -- -- -- -- 5\' 8"  (1.727 m) (!) 140.6 kg   Constitutional: Well-developed, well-nourished female in no acute distress.  Cardiovascular: normal rate Respiratory: normal effort GI: Abd soft, non-tender. Pos BS x 4 MS: Extremities nontender, no edema, normal ROM Neurologic: Alert and oriented x 4.  GU: Neg CVAT.  PELVIC EXAM: Deferred  Unable to obtain  FHT with doppler, bedside US with FHR 155, fetal movement visualized, subjectively normal fluid.   Pt informed that the ultrasound is considered a limited OB ultrasound and is not intended to be a complete ultrasound exam.  Patient also informed that the ultrasound is not being completed with the intent of assessing for fetal or placental anomalies or any pelvic abnormalities.  Explained that the purpose of today's ultrasound is to assess for  viability.  Patient acknowledges the purpose of the exam and the limitations of the study.     LAB RESULTS Results for orders placed or performed during the hospital encounter of 02/21/23 (from the past 24 hour(s))  ABO/Rh     Status: None   Collection Time: 02/22/23  2:40 AM  Result Value Ref Range   ABO/RH(D) B POS    No rh immune globuloin      NOT A RH IMMUNE GLOBULIN CANDIDATE, PT RH POSITIVE Performed at [facility], [address], [place], [place] [address]     --/--/B POS (08/18 0240)  IMAGING DG Knee Complete 4 Views Right  Result Date: 02/22/2023 CLINICAL DATA:  Fall, right knee injury EXAM: RIGHT KNEE - COMPLETE 4+ VIEW COMPARISON:  None Available. FINDINGS: Normal alignment. No acute fracture or dislocation. Mild lateral compartment degenerative arthritis with tiny osteophyte formation. No effusion. Soft tissues are unremarkable. IMPRESSION: 1. Mild lateral compartment degenerative arthritis. Electronically Signed   By: Helyn Numbers M.D.   On: 02/22/2023 01:58   DG Wrist Complete Right  Result Date: 02/22/2023 CLINICAL DATA:  Fall, a right wrist injury EXAM: RIGHT WRIST - COMPLETE 3+ VIEW COMPARISON:  None Available. FINDINGS: There is no evidence of fracture or dislocation. There is no evidence of arthropathy or other focal bone abnormality. Soft tissues are unremarkable. IMPRESSION: Negative. Electronically Signed   By: Helyn Numbers M.D.   On: 02/22/2023 01:58    MAU Management/MDM: Orders Placed This Encounter  Procedures    DG Wrist Complete Right   DG Knee Complete 4 Views Right   Diet NPO time specified   Apply ice to affected area (if injury is <48 hours old)   Immobilize affected extremity   Remove jewelry   ABO/Rh   Discharge patient    Meds ordered this encounter  Medications   DISCONTD: cyclobenzaprine (FLEXERIL) 10 MG tablet    Sig: Take 1 tablet (10 mg total) by mouth 3 (three) times daily as needed for muscle spasms.    Dispense:  20 tablet    Refill:  0    Order Specific Question:   Supervising Provider    Answer:   Milas Hock [[id number]]  cyclobenzaprine (FLEXERIL) tablet 10 mg    No acute findings.  Viable IUP on bedside US.  Pt with increasing overall body aches, back pain in MAU so Flexeril PO given and Rx sent to pt pharmacy.  F/U with prenatal care as scheduled, return to MAU as needed for emergencies.    ASSESSMENT 1. Fall from slip, trip, or stumble, initial encounter   2. Traumatic injury during pregnancy in second trimester   3. [redacted] weeks gestation of pregnancy   4. Blood type, Rh positive   5. Chronic hypertension in obstetric context in first trimester     PLAN Discharge home Allergies as of 02/22/2023       Reactions   Cetirizine Anaphylaxis   Itching and swelling        Medication List     STOP taking these medications    benzonatate 100 MG capsule Commonly known as: TESSALON   diphenhydrAMINE 25 MG tablet Commonly known as: BENADRYL   ibuprofen 600 MG tablet Commonly known as: ADVIL   magic mouthwash w/lidocaine Soln   mometasone 50 MCG/ACT nasal spray Commonly known as: Nasonex       TAKE these medications    albuterol (5 MG/ML) 0.5% nebulizer solution Commonly known as: PROVENTIL Take 0.5 mLs (2.5 mg total) by nebulization every 6 (six) hours as needed for wheezing or shortness of breath.   EPINEPHrine 0.3 mg/0.3 mL Soaj injection Commonly known as: EPI-PEN Inject 0.3 mLs (0.3 mg total) into the muscle once as needed for up to 1 dose  (severe tongue swelling, shortness of breath, anaphylaxis).   ranitidine 150 MG capsule Commonly known as: ZANTAC Take 1 capsule (150 mg total) by mouth daily for 14 days.        Follow-up Information     Your Prenatal Provider Follow up.   Why: As scheduled        [facility] Follow up.   Specialty: Obstetrics and Gynecology Why: Or closest emergency room in case of emergency Contact information: [address] [place] [address] [phone]                Sharen Counter Certified Nurse-Midwife 02/22/2023  6:41 AM

## 2023-02-22 NOTE — ED Notes (Signed)
Pt stated that her pelvic pain has increased since being triaged. Triage RN notified.

## 2023-06-22 NOTE — H&P (Signed)
 History and Physical         A/P  # Costochondritis   Recommended Tylenol   Home with FU at her OBGYN    #HTN  Normotensive on Procardia 60 XL    #Fetal Status  Reactive NST    Subjective     HPI: 27 y.o. G1P0000 at [redacted]w[redacted]d  presents for pain in her right up

## 2023-06-22 NOTE — Nursing Note (Signed)
 Ms. Laura Berger is a 27 y.o. G1P0000 at [redacted]w[redacted]d who presents to triage with her husband for ruq pain. Pt VSS, placed on EFM and TOCO.     PT reports +fetal movement, denies headache, vision changes, SOB, LOF and VB.  Pt rdenies feeling contractions.

## 2023-06-22 NOTE — Nursing Note (Signed)
 Pt discharged from labor and delivery in stable condition and undelivered at [redacted]w[redacted]d, after evaluation for ruq pain.   Pt evaluated by Dr. Lily Peer.  Discharge instructions reviewed. Pt verbalized understanding of all instructions. No questions or concerns

## 2023-07-04 ENCOUNTER — Encounter (HOSPITAL_BASED_OUTPATIENT_CLINIC_OR_DEPARTMENT_OTHER): Payer: Self-pay

## 2023-07-04 ENCOUNTER — Emergency Department
Admission: EM | Admit: 2023-07-04 | Discharge: 2023-07-05 | Disposition: A | Discharge status: Home / Self Care | Payer: Medicaid Other | Attending: Ophthalmology | Admitting: Ophthalmology

## 2023-07-04 DIAGNOSIS — Z3A33 33 weeks gestation of pregnancy: Secondary | ICD-10-CM | POA: Insufficient documentation

## 2023-07-04 DIAGNOSIS — O10913 Unspecified pre-existing hypertension complicating pregnancy, third trimester: Secondary | ICD-10-CM | POA: Insufficient documentation

## 2023-07-04 DIAGNOSIS — O99353 Diseases of the nervous system complicating pregnancy, third trimester: Secondary | ICD-10-CM | POA: Insufficient documentation

## 2023-07-04 DIAGNOSIS — G44209 Tension-type headache, unspecified, not intractable: Secondary | ICD-10-CM | POA: Insufficient documentation

## 2023-07-04 HISTORY — DX: Essential (primary) hypertension: I10

## 2023-07-04 HISTORY — DX: Unspecified asthma, uncomplicated: J45.909

## 2023-07-05 ENCOUNTER — Encounter (HOSPITAL_BASED_OUTPATIENT_CLINIC_OR_DEPARTMENT_OTHER): Payer: Self-pay

## 2023-07-05 DIAGNOSIS — G44209 Tension-type headache, unspecified, not intractable: Secondary | ICD-10-CM

## 2023-07-05 LAB — CBC
Absolute nRBC: 0 10*3/uL (ref <=0.00)
Hematocrit: 33.2 % — ABNORMAL LOW (ref 34.7–43.7)
Hemoglobin: 11.1 g/dL — ABNORMAL LOW (ref 11.4–14.8)
MCH: 31.8 pg (ref 25.1–33.5)
MCHC: 33.4 g/dL (ref 31.5–35.8)
MCV: 95.1 fL (ref 78.0–96.0)
MPV: 9.8 fL (ref 8.9–12.5)
Platelet Count: 301 10*3/uL (ref 142–346)
RBC: 3.49 10*6/uL — ABNORMAL LOW (ref 3.90–5.10)
RDW: 13 % (ref 11–15)
WBC: 9.07 10*3/uL (ref 3.10–9.50)
nRBC %: 0 /100{WBCs} (ref <=0.0)

## 2023-07-05 LAB — URINE PROTEIN/CREATININE RATIO
Urine Creatinine: 93.7 mg/dL
Urine Protein/Creatinine Ratio: 0.1
Urine Protein: 9 mg/dL (ref 1.0–14.0)

## 2023-07-05 LAB — URIC ACID: Uric Acid: 2.4 mg/dL — ABNORMAL LOW (ref 2.6–7.1)

## 2023-07-05 LAB — CREATININE
Creatinine: 0.7 mg/dL (ref 0.4–1.0)
GFR: >60 mL/min/{1.73_m2} (ref >=60.0)

## 2023-07-05 LAB — AST: AST (SGOT): 16 U/L (ref <=41)

## 2023-07-05 LAB — ALT: ALT: 11 U/L (ref <=55)

## 2023-07-05 LAB — LACTATE DEHYDROGENASE: LDH: 121 U/L (ref 120–331)

## 2023-07-05 MED ORDER — ACETAMINOPHEN 500 MG PO TABS
ORAL_TABLET | ORAL | Status: AC
Start: 2023-07-05 — End: 2023-07-05
  Administered 2023-07-05: 1000 mg via ORAL
  Filled 2023-07-05: qty 2

## 2023-07-05 MED ORDER — ACETAMINOPHEN 500 MG PO TABS
1000.0000 mg | ORAL_TABLET | Freq: Once | ORAL | Status: AC
Start: 2023-07-05 — End: 2023-07-05

## 2023-07-05 NOTE — ED Notes (Signed)
Patient given discharge instructions and all questions were answered.  Pt ambulated off unit with significant other.

## 2023-07-05 NOTE — OB ED Provider Note (Signed)
 OBSTETRICAL EMERGENCY DEPARTMENT EVALUATION    Date/Time: 07/05/23, 12:49 AM  Patient Name: Laura Berger  Attending: Maudie Flakes, DO    CC: HA, elevated BP reading at home3    History of Present Illness:   Laura Berger is a 27 y.o. G1P0 at [redacted]w[redacted]d, E

## 2023-07-05 NOTE — ED Notes (Signed)
 Pt arrives in OBED with reports of elevated BP, headache and pelvic pain.  Reports no leakage of fluid or vag bleeding, and GFM.  Pt also states the headache has just started happening today and has not taken any medication to treat.  MD notified of pt arr

## 2023-07-28 ENCOUNTER — Emergency Department: Payer: Medicaid Other

## 2023-07-28 ENCOUNTER — Emergency Department
Admission: EM | Admit: 2023-07-28 | Discharge: 2023-07-28 | Disposition: A | Discharge status: Home / Self Care | Payer: Medicaid Other | Attending: Obstetrics & Gynecology | Admitting: Obstetrics & Gynecology

## 2023-07-28 ENCOUNTER — Encounter (HOSPITAL_BASED_OUTPATIENT_CLINIC_OR_DEPARTMENT_OTHER): Payer: Self-pay

## 2023-07-28 DIAGNOSIS — O10913 Unspecified pre-existing hypertension complicating pregnancy, third trimester: Secondary | ICD-10-CM | POA: Insufficient documentation

## 2023-07-28 DIAGNOSIS — O368130 Decreased fetal movements, third trimester, not applicable or unspecified: Secondary | ICD-10-CM | POA: Insufficient documentation

## 2023-07-28 DIAGNOSIS — Z3A36 36 weeks gestation of pregnancy: Secondary | ICD-10-CM | POA: Insufficient documentation

## 2023-07-28 LAB — LACTATE DEHYDROGENASE: LDH: 143 U/L (ref 120–331)

## 2023-07-28 LAB — CREATININE
Creatinine: 0.6 mg/dL (ref 0.4–1.0)
GFR: >60 mL/min/{1.73_m2} (ref >=60.0)

## 2023-07-28 LAB — CBC
Absolute nRBC: 0 10*3/uL (ref <=0.00)
Hematocrit: 35.4 % (ref 34.7–43.7)
Hemoglobin: 11.9 g/dL (ref 11.4–14.8)
MCH: 31.4 pg (ref 25.1–33.5)
MCHC: 33.6 g/dL (ref 31.5–35.8)
MCV: 93.4 fL (ref 78.0–96.0)
MPV: 10.1 fL (ref 8.9–12.5)
Platelet Count: 344 10*3/uL (ref 142–346)
RBC: 3.79 10*6/uL — ABNORMAL LOW (ref 3.90–5.10)
RDW: 13 % (ref 11–15)
WBC: 8.12 10*3/uL (ref 3.10–9.50)
nRBC %: 0 /100{WBCs} (ref <=0.0)

## 2023-07-28 LAB — ALT: ALT: 16 U/L (ref <=55)

## 2023-07-28 LAB — URINE PROTEIN/CREATININE RATIO
Urine Creatinine: 34.8 mg/dL
Urine Protein: <7 mg/dL (ref 1.0–14.0)

## 2023-07-28 LAB — URIC ACID: Uric Acid: 2.9 mg/dL (ref 2.6–7.1)

## 2023-07-28 LAB — TYPE AND SCREEN
ABO Rh: B POS
Antibody Screen: NEGATIVE

## 2023-07-28 LAB — AST: AST (SGOT): 19 U/L (ref <=41)

## 2023-07-28 LAB — BLOOD TYPE CONFIRMATION: Blood Type Confirmation: B POS

## 2023-07-28 MED ORDER — NIFEDIPINE 10 MG PO CAPS
ORAL_CAPSULE | ORAL | Status: AC
Start: 2023-07-28 — End: 2023-07-28
  Administered 2023-07-28: 10 mg via ORAL
  Filled 2023-07-28: qty 1

## 2023-07-28 MED ORDER — NIFEDIPINE 10 MG PO CAPS
10.0000 mg | ORAL_CAPSULE | Freq: Once | ORAL | Status: AC
Start: 2023-07-28 — End: 2023-07-28

## 2023-07-28 NOTE — OB ED Provider Note (Signed)
 OBSTETRICAL EMERGENCY DEPARTMENT EVALUATION    Date/Time: 07/28/23, 10:27 PM  Patient Name: Laura Berger  Attending: No att. providers found    Chief Complaint   Patient presents with    Decreased Fetal Movement       History of Present Illness:   Laura Berger is a 28 y.o. G1P0 at [redacted]w[redacted]d, EDD 08/23/2023, by Patient Reported presenting to OBED with decreased fetal movement.  Endorses good fetal movement upon arrival to OBED.   Pt with cHTN - procardia  xl 60 mg daily.   Denies VB, LOF, CTX, HA, vision changes, CP, SOB, N/V, RUQ/epigastric pain.    Denies any fever, cough, SOB for herself or anyone in her household.     PNC with [facility]     Pregnancy complicated by:  Problem List[1]    Obstetrical History:   G1P0  OB History   Gravida Para Term Preterm AB Living   1             SAB IAB Ectopic Multiple Live Births                  # Outcome Date GA Lbr Len/2nd Weight Sex Type Anes PTL Lv   1 Current                Gynecological History:   Denies h/o abnormal PAP or STIs    Past Medical History:     Past Medical History:   Diagnosis Date    Asthma     Hypertension        Past Surgical History:   Past Surgical History[2]    Medications:     Discharge Medication List as of 07/28/2023  9:30 PM        CONTINUE these medications which have NOT CHANGED    Details   NIFEdipine  XL (PROCARDIA  XL) 60 MG 24 hr tablet Take 1 tablet (60 mg) by mouth daily, Historical Med      Prenatal Vit-Fe Fumarate-FA (PRENATAL 1 PLUS 1 PO) Take by mouth, Historical Med      albuterol sulfate HFA (PROVENTIL) 108 (90 Base) MCG/ACT inhaler Inhale 2 puffs into the lungs, Historical Med      budesonide-formoterol (SYMBICORT) 80-4.5 MCG/ACT inhaler Inhale 2 puffs into the lungs 2 (two) times daily, Historical Med              Allergies:   Allergies[3]    Social History:   Social History[4]    Family History:   Family History[5]    Review of Systems:  A ten point review of systems was performed and negative except for those noted in HPI      Physical  Exam:   BP 137/76   Pulse 83   Temp 98 F (36.7 C) (Oral)   Resp 16   Ht 5' 7 (1.702 m)   LMP 11/09/2022   BMI 58.26 kg/m       EFM: 140 bpm/mod variability/+accelerations/- decelerations    - cat 1, reactive   Toco: none    Gen: NAD, Aox3  Psych: Normal mood and behavior  CV:  clinically well-perfused  Pulm: non-labored breathing   Back: no CVAT  Abd: Gravid, NT  LE: + LE edema, symmetric bilaterally, no calf tenderness    Pelvic: not indicated        Labs & Imaging:   Labs  Results       Procedure Component Value Units Date/Time    Blood Type Confirmation [[id number]] Collected: 07/28/23 1923  Specimen: Blood, Venous Updated: 07/28/23 2100     Blood Type Confirmation B POS    Type and Screen [[id number]] Collected: 07/28/23 1847    Specimen: Blood, Venous Updated: 07/28/23 1957     ABO Rh B Pos     Antibody Screen Negative     Type And Screen Expiration 07/31/2023 23:59    Urine Protein/Creatinine Ratio [[id number]] Collected: 07/28/23 1848    Specimen: Urine, Clean Catch Updated: 07/28/23 1949     Urine Protein <7.0 mg/dL      Urine Creatinine 65.1 mg/dL      Urine Protein/Creatinine Ratio --    Lactate Dehydrogenase [[id number]]  (Normal) Collected: 07/28/23 1847    Specimen: Blood, Venous Updated: 07/28/23 1934     LDH 143 U/L     Creatinine [[id number]]  (Normal) Collected: 07/28/23 1847    Specimen: Blood, Venous Updated: 07/28/23 1934     Creatinine 0.6 mg/dL      GFR >39.9 fO/fpw/8.26 m2     AST [[id number]]  (Normal) Collected: 07/28/23 1847    Specimen: Blood, Venous Updated: 07/28/23 1934     AST (SGOT) 19 U/L     ALT [[id number]]  (Normal) Collected: 07/28/23 1847    Specimen: Blood, Venous Updated: 07/28/23 1934     ALT 16 U/L     Uric Acid [[id number]]  (Normal) Collected: 07/28/23 1847    Specimen: Blood, Venous Updated: 07/28/23 1934     Uric Acid 2.9 mg/dL     CBC without Differential [[id number]]  (Abnormal) Collected: 07/28/23 1847    Specimen: Blood, Venous Updated: 07/28/23 1924      WBC 8.12 x10 3/uL      Hemoglobin 11.9 g/dL      Hematocrit 64.5 %      Platelet Count 344 x10 3/uL      MPV 10.1 fL      RBC 3.79 x10 6/uL      MCV 93.4 fL      MCH 31.4 pg      MCHC 33.6 g/dL      RDW 13 %      nRBC % 0.0 /100 WBC      Absolute nRBC 0.00 x10 3/uL                 Imaging  US  OB Limited W Biophysical Profile    Result Date: 07/28/2023  1. Single living intrauterine pregnancy at  37 weeks 0 day gestational age by today's ultrasound and 36 week 2 day gestational age by history. Estimated fetal weight 3035 g, 67th percentile. 2. Biophysical profile score 8/8. 3. Systolic to diastolic cord Doppler ratio averages 2. Levada Cinnamon, MD 07/28/2023 9:14 PM     Prenatal Records Reviewed    Assessment & Plan:   28 y.o. G1P0 at [redacted]w[redacted]d presenting with decreased fetal movement with reassuring fetal testing  No evidence of acute maternal or fetal compromise.     #cHTN  -procardia  10 mg x 1 given in OBED by outgoing MD  - pt asymptomatic  - PEC labs wnl, P:C ratio unable to calculate    Findings discussed with patient and her support, who expressed understanding and are in agreement with the plan of care.   All questions answered to patient and support's apparent satisfaction.  Discharge home with [facility], PEC precautions  Reports MFM appt scheduled for tomorrow  F/u with OB as scheduled     Final diagnoses:   Decreased fetal movements in third trimester, single or unspecified  fetus   Chronic hypertension complicating or reason for care during pregnancy, third trimester   [redacted] weeks gestation of pregnancy       ED Disposition       ED Disposition   Discharge    Condition   --    Date/Time   Tue Jul 28, 2023  9:40 PM    Comment   Trust Crago discharge to home/self care.    Condition at disposition: Stable                 28 FORBES Geralds, MD FACOG  [facility]  [facility]           [1]   Patient Active Problem List  Diagnosis    Chronic hypertension complicating or reason for care during pregnancy,  third trimester    Decreased fetal movements in third trimester, single or unspecified fetus    [redacted] weeks gestation of pregnancy   [2]   Past Surgical History:  Procedure Laterality Date    HIP SURGERY     [3]   Allergies  Allergen Reactions    Zyrtec [Cetirizine] Anaphylaxis   [4]   Social History  Tobacco Use    Smoking status: Never     Passive exposure: Never    Smokeless tobacco: Never   Vaping Use    Vaping status: Never Used   Substance Use Topics    Alcohol use: Not Currently    Drug use: Never   [5] No family history on file.

## 2023-07-28 NOTE — Progress Notes (Signed)
 Discharge orders placed. Pt verbalizes understanding. Pt to follow up with next OB visit.      07/28/23 2146   Departure Condition   Patient Discharged by Provider Yes   Departure Condition Stable   Mobility at [facility]   Patient Teaching Discharge instructions reviewed;Follow-up care reviewed;Patient verbalized understanding   Patient teaching details Kick count   Departure Mode With spouse;By car

## 2023-07-30 ENCOUNTER — Observation Stay
Admission: RE | Admit: 2023-07-30 | Discharge: 2023-07-30 | Disposition: A | Discharge status: Home / Self Care | Payer: Medicaid Other | Source: Ambulatory Visit | Attending: Obstetrics & Gynecology | Admitting: Obstetrics & Gynecology

## 2023-07-30 ENCOUNTER — Encounter (HOSPITAL_BASED_OUTPATIENT_CLINIC_OR_DEPARTMENT_OTHER): Payer: Self-pay | Admitting: Obstetrics & Gynecology

## 2023-07-30 DIAGNOSIS — O368390 Maternal care for abnormalities of the fetal heart rate or rhythm, unspecified trimester, not applicable or unspecified: Principal | ICD-10-CM | POA: Insufficient documentation

## 2023-07-30 DIAGNOSIS — O288 Other abnormal findings on antenatal screening of mother: Principal | ICD-10-CM | POA: Diagnosis present

## 2023-07-30 LAB — HEPATITIS B (HBV) SURFACE ANTIGEN WITH REFLEX TO CONFIRMATION: Hepatitis B Surface Antigen: NEGATIVE

## 2023-07-30 LAB — RPR: RPR: NONREACTIVE

## 2023-07-30 LAB — GROUP B STREP TRANSCRIBED: GBS Transcribed: POSITIVE

## 2023-07-30 LAB — HIV-1/2, ANTIGEN AND ANTIBODY WITH REFLEX TO CONFIRMATION: HIV Ag/Ab, 4th Generation: NEGATIVE

## 2023-07-30 NOTE — Progress Notes (Signed)
 Pt discharged per provider. Educated on kick counts, signs of labor, and symptoms of preeclampsia. Pt ambulated out of unit in stable condition.

## 2023-07-30 NOTE — Discharge Instructions (Addendum)
 Kick Counts   It's normal to worry about your baby's health. Generally, you will feel your baby start to move in your 2nd trimester. Often around 16 to 24 weeks. Learning the pattern of your baby's movements is one way to know what's normal for you and baby. This is called a kick count. Experts vary on how many movements you should feel within a set amount of time. Talk with your provider about how many movements you should feel.   How to count kicks    Here is just one way to do kick counts. Always follow your provider's instructions. Starting at 28 weeks, count your baby's movements daily. Time how long it takes you to feel 10 kicks, flutters, swishes, or rolls. Ideally, you want to feel at least 10 movements in 2 hours. You will likely feel 10 movements in less time than that.   Here are tips for counting kicks:   Choose a time when the baby is active, such as after a meal.   Sit comfortably or lie on your side.   The first time the baby moves, write down the time.   Count until the baby has moved  10 times. This can take from 20 minutes to 2 hours.   If you haven't felt 10 kicks by the end of 2 hours, call your provider for next steps.  Try to do it at the same time each day.  When to call your healthcare provider  Follow your provider's instructions about when to call about your baby's movements. Don't hesitate to call if you have concerns.   Call your provider  right away if:   Your baby moves fewer than 10 times in 2 hours.  Your baby moves much less often than on the days before.  You haven't felt your baby move all day.  StayWell last reviewed this educational content on 04/07/2023   2000-2024 [employer], [place]. All rights reserved. This information is not intended as a substitute for professional medical care. Always follow your healthcare professional's instructions.    Recognizing Labor  The beginning of labor is the beginning of birth. You'll start to feel strong contractions. That's when the  muscles of your uterus tighten up to help push your baby out during birth.     Yes, labor has likely started   Signs of labor include:  Your contractions are getting stronger and more painful instead of weaker. You'll likely feel them throughout your whole uterus.  Your contractions are regular. This means that you feel them about every 5 to 10 minutes. And they are getting closer together.  You have pink-colored or blood-streaked fluid from your vagina.  You feel that the baby has dropped lower in your pelvis   Your water  breaks. It may be a gush or a slow trickle of clear fluid from your vagina.  No, it's likely not real labor   Signs of false labor include:  Your contractions aren't regular or strong.  You feel the contractions only in your lower uterus.  Your contractions go away when you walk or change position.  Your contractions go away after drinking fluids.  When to call your healthcare provider   Call your healthcare provider or clinic right away if you notice any of these signs:   Fluid from your vagina, with or without contractions.  Bleeding heavy enough that you need a sanitary pad.  You don't feel your baby moving as much as before.  Note  Contractions  are timed by both of these measures:   The length of each contraction from its start to its finish.  How far apart the contractions are --the time between the start of one contraction and the start of the next contraction.  StayWell last reviewed this educational content on 06/06/2021   2000-2023 [employer], [place]. All rights reserved. This information is not intended as a substitute for professional medical care. Always follow your healthcare professional's instructions.      Understanding Preeclampsia  Preeclampsia is a condition that can happen in pregnancy. It includes high blood pressure (hypertension), swelling, and signs of organ problems. It can show up around week 20 of pregnancy. It often goes away by 12 weeks after you give birth.  It can lead to serious health risks for you and your baby. During your pregnancy, your healthcare provider will watch your blood pressure.     Your blood pressure will be monitored regularly throughout your pregnancy to help check for preeclampsia.     Dangers of preeclampsia   If not treated, preeclampsia can cause problems for you and your baby. The placenta is the organ that nourishes your baby. It may tear away from the wall of the uterus. This can put the baby at risk for health problems (fetal distress). It can put the baby at risk for preterm birth. Preeclampsia can also cause these health problems in you:  Kidney failure or other organ damage  Seizures  Stroke  Who's at risk for preeclampsia?   No one knows what causes preeclampsia. It can happen in any pregnant person. But there are things that increase your risk. You may need to take a daily low dose of aspirin if you are at risk for preeclampsia.  You're at higher risk for preeclampsia if you have any of these:  Diabetes  High blood pressure  Obesity  Kidney disease  Autoimmune disease such as lupus  A family history of preeclampsia  You're at higher risk if any of these apply to you:  This is your first pregnancy  You are having twins or more  You're under age 20 or over age 43  You used in vitro fertilization  You are Black  And you're at higher risk if you had any of these in a past pregnancy:  Preeclampsia  Intrauterine growth restriction (IUGR)  Preterm birth  Placental abruption  Fetal death  Symptoms  A common symptom of preeclampsia is high blood pressure. Other symptoms may include:  Fast weight gain  Protein in your urine  Headache  Belly (abdominal) pain on your right side  Vision problems such as flashes or spots  Swelling (edema) in your face or hands (this often happens near the end of a normal pregnancy)  Tests you may have  Your healthcare provider will want to check your blood pressure. This will need to be done often in your pregnancy.  If your blood pressure is high, you may have these tests:  Urine tests to look for protein  Blood tests to confirm preeclampsia  Fetal monitoring to make sure that your baby is healthy  Treating preeclampsia  Preeclampsia almost always ends soon after you give birth. Until then, your healthcare provider can help you manage it.  If your symptoms are mild, you may to:     Limit your activity  Rest in bed  Not do heavy lifting     If your symptoms are severe, you will stay in the hospital. Treatment here  may include:  Limits to your activity. This is to help control your blood pressure. You should not lift anything heavy. You will need to spend 8 hours a day lying down with your feet up.  Magnesium  IV (intravenous) drip. This is done during labor. It's to prevent seizures  Induced labor or cesarean section.  When to call your healthcare provider  Call your healthcare provider if your symptoms start quickly or are severe. This includes swelling, weight gain, or other symptoms. Some cases of preeclampsia are more severe than others. Your symptoms also may change or get worse as you get closer to your due date.  Once you give birth  In most cases, preeclampsia goes away on its own soon after you give birth. This is often by the 12th week after you deliver. Within days after you give birth, your blood pressure, swelling, and other symptoms should get better. But for some people, problems from preeclampsia can continue after birth.  Postpartum preeclampsia   Preeclampsia that starts after birth is rare. There are 2 types:  Postpartum preeclampsia. This may start in the first 48 hours after birth.  Late-onset preeclampsia. This starts more than 48 hours after birth.  Both of these types are rare. But call your healthcare provider right away if you have symptoms of preeclampsia after you give birth.  How daily issues affect your health  Many things in your daily life impact your health. This can include transportation, money  problems, housing, access to food, and child care. If you can't get to medical appointments, you may not receive the care you need. When money is tight, it may be difficult to pay for medicines. And living far from a grocery store can make it hard to buy healthy food.  If you have concerns in any of these or other areas, talk with your healthcare team. They may know of local resources to assist you. Or they may have a staff person who can help.  StayWell last reviewed this educational content on 04/06/2020   2000-2023 [employer], [place]. All rights reserved. This information is not intended as a substitute for professional medical care. Always follow your healthcare professional's instructions.

## 2023-07-30 NOTE — Progress Notes (Signed)
 Pt came with non reactive NST from office. VSS afebrile. EFM adjusted multiple times. FHR noted at 130-140. Pt endorses fetal movement. MD notified of pt arrival

## 2023-08-02 ENCOUNTER — Encounter (INDEPENDENT_AMBULATORY_CARE_PROVIDER_SITE_OTHER): Payer: Self-pay

## 2023-08-03 ENCOUNTER — Encounter: Payer: Self-pay | Admitting: Obstetrics & Gynecology

## 2023-08-05 ENCOUNTER — Emergency Department
Admission: EM | Admit: 2023-08-05 | Discharge: 2023-08-05 | Disposition: A | Discharge status: Home / Self Care | Payer: Medicaid Other | Attending: Obstetrics & Gynecology | Admitting: Obstetrics & Gynecology

## 2023-08-05 DIAGNOSIS — Z3A37 37 weeks gestation of pregnancy: Secondary | ICD-10-CM | POA: Insufficient documentation

## 2023-08-05 DIAGNOSIS — O10913 Unspecified pre-existing hypertension complicating pregnancy, third trimester: Secondary | ICD-10-CM | POA: Insufficient documentation

## 2023-08-05 DIAGNOSIS — O10919 Unspecified pre-existing hypertension complicating pregnancy, unspecified trimester: Secondary | ICD-10-CM

## 2023-08-05 LAB — URINE PROTEIN/CREATININE RATIO
Urine Creatinine: 68.6 mg/dL
Urine Protein/Creatinine Ratio: 0.2
Urine Protein: 12 mg/dL (ref 1.0–14.0)

## 2023-08-05 LAB — CBC
Absolute nRBC: 0 10*3/uL (ref <=0.00)
Hematocrit: 33.3 % — ABNORMAL LOW (ref 34.7–43.7)
Hemoglobin: 11.1 g/dL — ABNORMAL LOW (ref 11.4–14.8)
MCH: 31.5 pg (ref 25.1–33.5)
MCHC: 33.3 g/dL (ref 31.5–35.8)
MCV: 94.6 fL (ref 78.0–96.0)
MPV: 10.2 fL (ref 8.9–12.5)
Platelet Count: 307 10*3/uL (ref 142–346)
RBC: 3.52 10*6/uL — ABNORMAL LOW (ref 3.90–5.10)
RDW: 13 % (ref 11–15)
WBC: 7.93 10*3/uL (ref 3.10–9.50)
nRBC %: 0 /100{WBCs} (ref <=0.0)

## 2023-08-05 LAB — ALT: ALT: 16 U/L (ref <=55)

## 2023-08-05 LAB — AST: AST (SGOT): 19 U/L (ref <=41)

## 2023-08-05 LAB — CREATININE
Creatinine: 0.6 mg/dL (ref 0.4–1.0)
GFR: >60 mL/min/{1.73_m2} (ref >=60.0)

## 2023-08-05 LAB — URIC ACID: Uric Acid: 3.1 mg/dL (ref 2.6–7.1)

## 2023-08-05 LAB — LACTATE DEHYDROGENASE: LDH: 120 U/L (ref 120–331)

## 2023-08-05 NOTE — Discharge Instructions (Addendum)
Call your doctor if:   Contractions become stronger and regular every 5-10 minutes for one hour.   Bag of water ruptures. (Sudden gush of fluid or continuous leak)   Vaginal bleeding occurs (bright red, running down legs or passed blood clots).    Spotting after a vaginal exam or mucous bloody show is normal.   You experience any unusually sharp abdominal pain.   You notice a decrease in fetal movement.  If you notice a decrease in the fetal movement.  Get something to eat and drink, lay on your side, place you had on your abdomen and perform your fetal kick counts.  Have this information available for your MD.   You have persistent severe headaches.   You have blurred vision or spots before the eyes.   You have persistent nausea and vomiting.   You have sudden increase swelling of hands, feet, face, and ankles (if suddenly your rings are too tight).   You have chills and a fever equalled to or greater than 100.4 (not accompanied by a cold).   You have very frequent urination or burning with urination.   You have further concerns or questions.

## 2023-08-05 NOTE — OB ED Provider Note (Signed)
 [facility] ED PROVIDER NOTE        Chief Complaint: Elevated BP    HPI:   Laura Berger is a 28 y.o. G1P0 female with an LMP of Patient's last menstrual period was 11/09/2022. Gestational age of [redacted]w[redacted]d and Estimated Date of Delivery: 08/23/23, who presents to the hospital for: Elevated BP    History of chronic HTN.  Was on meds prior to pregnancy.  Switched to nifedipine  XL 30 mg daily at start of pregnancy.  Uptitrated to 60 mg daily in November or December.  Checks her BP at home.  Usually 140s/80s at home.  Today she noticed her feet swelling so she checked her BP.  She got a reading as high 174/99 so she presents here for eval.  She denies headache, vision changes, or upper abd pain.  Reports pelvic pressure and right leg pain that she attributes to sciatica.  Sometimes has ctxs but they go away.  No vag bleeding or leaking.  Normal fetal movement.      Her prenatal care is with [facility].  Pregnancy significant for:  Chronic HTN on nifedipine   Obesity  Asthma  GBS positive    Review of Systems:  Review of Systems   Constitutional:  Negative for fever.   Eyes:  Negative for visual disturbance.   Cardiovascular:  Positive for leg swelling.   Gastrointestinal:  Negative for abdominal pain.   Genitourinary:  Positive for pelvic pain (pressure). Negative for vaginal bleeding and vaginal discharge.   Musculoskeletal:  Positive for back pain.   Neurological:  Negative for headaches.       Allergies[1]    OB History   Gravida Para Term Preterm AB Living   1             SAB IAB Ectopic Multiple Live Births                  # Outcome Date GA Lbr Len/2nd Weight Sex Type Anes PTL Lv   1 Current                Home Medications               albuterol sulfate HFA (PROVENTIL) 108 (90 Base) MCG/ACT inhaler     Inhale 2 puffs into the lungs     budesonide-formoterol (SYMBICORT) 80-4.5 MCG/ACT inhaler     Inhale 2 puffs into the lungs 2 (two) times daily     NIFEdipine  XL (PROCARDIA  XL) 60 MG 24 hr tablet     Take 1 tablet (60  mg) by mouth daily     Prenatal Vit-Fe Fumarate-FA (PRENATAL 1 PLUS 1 PO)     Take by mouth            Social History[2]    Medical History[3]    Past Surgical History[4]    Family History[5]      Vital Signs:     Vitals:    08/05/23 1712   BP: (!) 144/84   Pulse: (!) 107   Resp: 18   Temp: 99.1 F (37.3 C)   TempSrc: Oral   Weight: (!) 173.3 kg   Height: 5' 7 (1.702 m)       Physical Exam:     Gen: alert, no acute distress  Mental status: mood appropriate  CV: RR, no murmurs  Lungs: CTAB, breathing non-labored  Abd: gravid, nontender to palp, exam limited by severe obesity  Pelvic: normal vulva without lesions  Ext: 1+  lower leg edema, no pitting, no calf tenderness    OB Evaluation:   Cervical Exam:  Closed/long/-3/soft/posterior        FHT:   Baseline Rate: 140 BPM, mod var, accels present, no decels - interpretation limited due to incomplete tracing    Toco:  Mode: Toco, Palpation  Contraction Frequency: irritability  Resting Tone Palpated: Soft    Occasional ctxs    Labs:     pending    MDM and ED Course:     ED Medication Orders (From admission, onward)      None             Medical Decision Making  Amount and/or Complexity of Data Reviewed  Labs: ordered.      Interim Summary:  28 yo G1P0 at [redacted]w[redacted]d with chronic HTN and report of severely elevated BP at home.  No symptoms of severe features of preE.  BP here mildly elevated - sounds like baseline for her.  Preeclampsia labs ordered.  Need to rule out superimposed preE.  Patient signed out to on-coming OBED physician.      Procedures    Clinical Impression & Disposition:     Clinical Impression  Final diagnoses:   None        ED Disposition       None             New Prescriptions    No medications on file          Comer JONELLE Sago, MD         [1]   Allergies  Allergen Reactions    Zyrtec [Cetirizine] Anaphylaxis   [2]   Social History  Tobacco Use    Smoking status: Never     Passive exposure: Never    Smokeless tobacco: Never   Vaping Use    Vaping status:  Never Used   Substance Use Topics    Alcohol use: Not Currently    Drug use: Never   [3]   Past Medical History:  Diagnosis Date    Asthma     Hypertension    [4]   Past Surgical History:  Procedure Laterality Date    HIP SURGERY     [5] No family history on file.

## 2023-08-05 NOTE — ED Notes (Signed)
 Pt presents with elevated BP at home. EFM and TOCO applied.

## 2023-08-05 NOTE — Progress Notes (Signed)
 08/05/23 1925   Departure Condition   Patient Discharged by Provider Yes   Departure Condition Stable   Mobility at [facility] Ambulatory   Patient Teaching Discharge instructions reviewed;Follow-up care reviewed;Patient verbalized understanding   Prescription given to patient? No   Departure Mode With spouse     Discharge in stable condition

## 2023-08-05 NOTE — OB ED Provider Note (Signed)
 OB ED Provider Note - Follow Up    28 yo G1 at [redacted]w[redacted]d with known CHTN on Nifedapine XL 60 mg daily presents with elevated BP 174/99 this afternoon at home. Normally BP 140/80's or lower at home. Denies HA, vision changes and RUQ pain. Bps in the OBED mostly 140's/60-80's, one 144/84. PreE labs normal including P/C 0.2. No diagnosis of superimposed PreE at this time. Has induction scheduled 2/10. Has appointment with primary ob provider tomorrow, plans to discuss possible IOL sooner, particularly if Bps continue to creep up. Continue current dose of Nifedapine. PreE precautions reviewed. Discharge home.     Tinnie Herb, MD  [facility] Hospitalist

## 2023-08-17 ENCOUNTER — Inpatient Hospital Stay
Admission: RE | Admit: 2023-08-17 | Discharge: 2023-08-24 | DRG: 540 | Disposition: A | Discharge status: Home / Self Care | Payer: Medicaid Other | Attending: Obstetrics & Gynecology | Admitting: Obstetrics & Gynecology

## 2023-08-17 ENCOUNTER — Observation Stay (HOSPITAL_BASED_OUTPATIENT_CLINIC_OR_DEPARTMENT_OTHER): Payer: Medicaid Other

## 2023-08-17 ENCOUNTER — Encounter (HOSPITAL_BASED_OUTPATIENT_CLINIC_OR_DEPARTMENT_OTHER): Payer: Self-pay | Admitting: Obstetrics & Gynecology

## 2023-08-17 DIAGNOSIS — Z3490 Encounter for supervision of normal pregnancy, unspecified, unspecified trimester: Principal | ICD-10-CM

## 2023-08-17 DIAGNOSIS — O99214 Obesity complicating childbirth: Secondary | ICD-10-CM | POA: Diagnosis present

## 2023-08-17 DIAGNOSIS — Z7951 Long term (current) use of inhaled steroids: Secondary | ICD-10-CM

## 2023-08-17 DIAGNOSIS — O3413 Maternal care for benign tumor of corpus uteri, third trimester: Secondary | ICD-10-CM | POA: Diagnosis present

## 2023-08-17 DIAGNOSIS — Z3A39 39 weeks gestation of pregnancy: Secondary | ICD-10-CM

## 2023-08-17 DIAGNOSIS — J45909 Unspecified asthma, uncomplicated: Secondary | ICD-10-CM | POA: Diagnosis present

## 2023-08-17 DIAGNOSIS — O9952 Diseases of the respiratory system complicating childbirth: Secondary | ICD-10-CM | POA: Diagnosis present

## 2023-08-17 DIAGNOSIS — D259 Leiomyoma of uterus, unspecified: Secondary | ICD-10-CM | POA: Diagnosis present

## 2023-08-17 DIAGNOSIS — Z79899 Other long term (current) drug therapy: Secondary | ICD-10-CM

## 2023-08-17 DIAGNOSIS — O1092 Unspecified pre-existing hypertension complicating childbirth: Principal | ICD-10-CM | POA: Diagnosis present

## 2023-08-17 DIAGNOSIS — O99824 Streptococcus B carrier state complicating childbirth: Secondary | ICD-10-CM | POA: Diagnosis present

## 2023-08-17 DIAGNOSIS — O10913 Unspecified pre-existing hypertension complicating pregnancy, third trimester: Principal | ICD-10-CM

## 2023-08-17 LAB — LAB USE ONLY - CBC WITH DIFFERENTIAL
Absolute Basophils: 0.03 10*3/uL (ref 0.00–0.08)
Absolute Eosinophils: 0.02 10*3/uL (ref 0.00–0.44)
Absolute Immature Granulocytes: 0.04 10*3/uL (ref 0.00–0.07)
Absolute Lymphocytes: 1.79 10*3/uL (ref 0.42–3.22)
Absolute Monocytes: 0.73 10*3/uL (ref 0.21–0.85)
Absolute Neutrophils: 4.7 10*3/uL (ref 1.10–6.33)
Absolute nRBC: 0 10*3/uL (ref <=0.00)
Basophils %: 0.4 %
Eosinophils %: 0.3 %
Hematocrit: 34.3 % — ABNORMAL LOW (ref 34.7–43.7)
Hemoglobin: 11.5 g/dL (ref 11.4–14.8)
Immature Granulocytes %: 0.5 %
Lymphocytes %: 24.5 %
MCH: 31.8 pg (ref 25.1–33.5)
MCHC: 33.5 g/dL (ref 31.5–35.8)
MCV: 94.8 fL (ref 78.0–96.0)
MPV: 10.2 fL (ref 8.9–12.5)
Monocytes %: 10 %
Neutrophils %: 64.3 %
Platelet Count: 304 10*3/uL (ref 142–346)
Preliminary Absolute Neutrophil Count: 4.7 10*3/uL (ref 1.10–6.33)
RBC: 3.62 10*6/uL — ABNORMAL LOW (ref 3.90–5.10)
RDW: 14 % (ref 11–15)
WBC: 7.31 10*3/uL (ref 3.10–9.50)
nRBC %: 0 /100{WBCs} (ref <=0.0)

## 2023-08-17 LAB — CREATININE
Creatinine: 0.7 mg/dL (ref 0.4–1.0)
GFR: >60 mL/min/{1.73_m2} (ref >=60.0)

## 2023-08-17 LAB — TYPE AND SCREEN
ABO Rh: B POS
Antibody Screen: NEGATIVE

## 2023-08-17 LAB — URIC ACID: Uric Acid: 3.4 mg/dL (ref 2.6–7.1)

## 2023-08-17 LAB — AST: AST (SGOT): 26 U/L (ref <=41)

## 2023-08-17 LAB — URINE PROTEIN/CREATININE RATIO
Urine Creatinine: 102.2 mg/dL
Urine Protein/Creatinine Ratio: 0.1
Urine Protein: 11 mg/dL (ref 1.0–14.0)

## 2023-08-17 LAB — ALT: ALT: 18 U/L (ref <=55)

## 2023-08-17 LAB — LACTATE DEHYDROGENASE: LDH: 199 U/L (ref 120–331)

## 2023-08-17 MED ORDER — MISOPROSTOL 25 MCG PO SPLIT TAB
25.0000 ug | ORAL_TABLET | ORAL | Status: DC | PRN
Start: 2023-08-17 — End: 2023-08-21
  Administered 2023-08-17 – 2023-08-19 (×11): 25 ug via ORAL
  Filled 2023-08-17 (×11): qty 1

## 2023-08-17 MED ORDER — ACETAMINOPHEN 325 MG PO TABS
650.0000 mg | ORAL_TABLET | ORAL | Status: DC | PRN
Start: 2023-08-17 — End: 2023-08-21

## 2023-08-17 MED ORDER — TERBUTALINE SULFATE 1 MG/ML IJ SOLN
0.2500 mg | Freq: Once | INTRAMUSCULAR | Status: DC | PRN
Start: 2023-08-17 — End: 2023-08-21

## 2023-08-17 MED ORDER — ACETAMINOPHEN 650 MG RE SUPP
650.0000 mg | RECTAL | Status: DC | PRN
Start: 2023-08-17 — End: 2023-08-21

## 2023-08-17 MED ORDER — ONDANSETRON HCL 4 MG/2ML IJ SOLN
4.0000 mg | Freq: Three times a day (TID) | INTRAMUSCULAR | Status: DC | PRN
Start: 2023-08-17 — End: 2023-08-21

## 2023-08-17 MED ORDER — PROCHLORPERAZINE 25 MG RE SUPP
25.0000 mg | Freq: Two times a day (BID) | RECTAL | Status: DC | PRN
Start: 2023-08-17 — End: 2023-08-21

## 2023-08-17 MED ORDER — NALOXONE HCL 0.4 MG/ML IJ SOLN (WRAP)
0.2000 mg | INTRAMUSCULAR | Status: DC | PRN
Start: 2023-08-17 — End: 2023-08-21

## 2023-08-17 MED ORDER — NIFEDIPINE ER OSMOTIC RELEASE 30 MG PO TB24
60.0000 mg | ORAL_TABLET | Freq: Every day | ORAL | Status: DC
Start: 2023-08-18 — End: 2023-08-21
  Administered 2023-08-18 – 2023-08-21 (×4): 60 mg via ORAL
  Filled 2023-08-17: qty 2
  Filled 2023-08-17: qty 1
  Filled 2023-08-17 (×2): qty 2

## 2023-08-17 MED ORDER — CALCIUM CARBONATE ANTACID 500 MG PO CHEW
1000.0000 mg | CHEWABLE_TABLET | Freq: Three times a day (TID) | ORAL | Status: DC | PRN
Start: 2023-08-17 — End: 2023-08-21

## 2023-08-17 MED ORDER — SODIUM CHLORIDE (PF) 0.9 % IJ SOLN
3.0000 mL | Freq: Three times a day (TID) | INTRAMUSCULAR | Status: DC
Start: 2023-08-17 — End: 2023-08-21

## 2023-08-17 MED ORDER — SIMETHICONE 80 MG PO CHEW
80.0000 mg | CHEWABLE_TABLET | Freq: Four times a day (QID) | ORAL | Status: DC | PRN
Start: 2023-08-17 — End: 2023-08-21

## 2023-08-17 MED ORDER — PROCHLORPERAZINE MALEATE 5 MG PO TABS
10.0000 mg | ORAL_TABLET | Freq: Four times a day (QID) | ORAL | Status: DC | PRN
Start: 2023-08-17 — End: 2023-08-21

## 2023-08-17 MED ORDER — ALUM & MAG HYDROXIDE-SIMETH 200-200-20 MG/5ML PO SUSP
30.0000 mL | Freq: Four times a day (QID) | ORAL | Status: DC | PRN
Start: 2023-08-17 — End: 2023-08-21

## 2023-08-17 MED ORDER — PROCHLORPERAZINE EDISYLATE 10 MG/2ML IJ SOLN
10.0000 mg | Freq: Four times a day (QID) | INTRAMUSCULAR | Status: DC | PRN
Start: 2023-08-17 — End: 2023-08-21

## 2023-08-17 MED ORDER — SOD CITRATE-CITRIC ACID 500-334 MG/5ML PO SOLN
30.0000 mL | Freq: Once | ORAL | Status: DC | PRN
Start: 2023-08-17 — End: 2023-08-21

## 2023-08-17 MED ORDER — ONDANSETRON 4 MG PO TBDP
4.0000 mg | ORAL_TABLET | Freq: Three times a day (TID) | ORAL | Status: DC | PRN
Start: 2023-08-17 — End: 2023-08-21

## 2023-08-18 MED ORDER — NALBUPHINE HCL 10 MG/ML IJ SOLN
10.0000 mg | INTRAMUSCULAR | Status: DC | PRN
Start: 2023-08-18 — End: 2023-08-21

## 2023-08-18 NOTE — Plan of Care (Signed)
 Patient is a G1P0, [redacted]w[redacted]d induction of labor for Chronic Hypertension. Vital signs WNL. Cytotec  25mcg q4hr for cervical ripening. Patient plans of epidural for pain relief further into labor progress. Patient and family oriented to room, call bell and side table within reach. No questions at this time.     Problem: Vaginal/Cesarean Delivery  Goal: Maternal Status within defined parameters  Outcome: Progressing  Goal: Evidence of Fetal Well Being  Outcome: Progressing  Goal: Free from Maternal/Fetal Infection  Outcome: Progressing  Goal: Intrapartum management of pain/discomfort  Outcome: Progressing     Problem: Pain interferes with ability to perform ADL  Goal: Pain at adequate level as identified by patient  Outcome: Progressing     Problem: Side Effects from Pain Analgesia  Goal: Patient will experience minimal side effects of analgesic therapy  Outcome: Progressing

## 2023-08-18 NOTE — H&P (Signed)
 WOMEN'S SERVICE LINE  OBSTETRIC ADMISSION NOTE        Assessment     Laura Berger is a 28 y.o. G1P0 @ [redacted]w[redacted]d who presents for induction of labor.    Contributing Medical Conditions:  Hypertension - Chronic Maternal: on Procardia  60mg  daily  Morbid Obesity (Pre-pregnancy BMI >40): EFW @38  wk 87th percentile  Asthma   GBS positive     Plan          Admit to L&D - Anticipate NSVD       Routine Labor Admission Orders placed       Sono Confirmation of Fetal Position       Cervical Ripening: Cytotec        Pitocin  Augmentation as needed       Antibiotics: GBS prophylaxis ordered       Analgesia: Epidural if/when desired    Labor, Possible C-Section: The patient was consented for admission to labor and delivery. We discussed that although a vaginal delivery is always our goal, there is always a possibility of cesarean delivery for maternal and fetal safety or for arrest of a labor. There are risks of bleeding, infection, and indications for unanticipated surgical procedures during her labor and delivery. Should this occur she understands that blood transfusion, antibiotics and other medications may need to be administered to assure the health and safety of the patient and her baby. She understands and consents to the above.                                                                                                                                                      History of Present Illness     Subjective:   Laura Berger is a 28 y.o. G1P0 female at [redacted]w[redacted]d, Estimated Date of Delivery: 08/23/23.  She is being admitted for induction of labor.  Significant factors related to the current pregnancy include: as stated above.  Patient reports no complaints.  She denies contractions, vaginal bleeding, leakage of fluid, nausea or vomitting, epigastric or RUQ pain, blurred vision, and headache.  Fetal Movement: normal.    Prenatal Record Review:  Lab Results   Component Value Date    ABORH B Pos 08/17/2023     RUBELLAABIGG Immune 02/10/2023    HEPBSAG Negative 07/30/2023    RPR Nonreactive 07/30/2023    GBS Positive 07/30/2023       US  OB Limited W Biophysical Profile    Result Date: 07/28/2023  1. Single living intrauterine pregnancy at  37 weeks 0 day gestational age by today's ultrasound and 36 week 2 day gestational age by history. Estimated fetal weight 3035 g, 67th percentile. 2. Biophysical profile score 8/8. 3. Systolic to diastolic cord Doppler ratio averages 2. Levada Cinnamon, MD 07/28/2023 9:14 PM    US  Obstetric; 1st trimester  Result Date: 12/30/2022  IMPRESSION: 1. Single live intrauterine pregnancy with sonographic gestational age of [redacted] weeks 6 days. Estimated date of delivery = 08/27/2023 2. Otherwise unremarkable exam without current evidence of subchorionic bleed or other significant abnormality. Electronically signed by Glean KANDICE Cullen, MD on 12/30/2022 5:36 PM        Physical Exam / Objective Findings     Objective:  Temp:  [98.3 F (36.8 C)-98.6 F (37 C)] 98.3 F (36.8 C)  Heart Rate:  [78-109] 78  Resp Rate:  [16-18] 16  BP: (126-144)/(68-95) 126/68    General:   alert, well appearing, in no apparent distress, oriented to person, place and time, overweight   Lungs:   normal effort, breathing comfortably on RA   Heart:   warm and well perfused   Abdomen:  gravid, non-tender   SVE:  Dilation: Closed  Effacement (%): 10  Fetal Station: -3  OB Examiner: Malorie S., RN      Bishop Score: (!) 0 (08/17/2023  6:00 PM)            FHT: Baseline Rate: 130 BPM, Variability: Moderate, Pattern: Accelerations, FHR Category: Category I  Toco: Contraction Frequency: occ with variability    Lab Results   Component Value Date    WBC 7.31 08/17/2023    HGB 11.5 08/17/2023    HCT 34.3 (L) 08/17/2023    PLT 304 08/17/2023    Lab Results   Component Value Date    ABORH B Pos 08/17/2023    AST 26 08/17/2023    ALT 18 08/17/2023    URICACID 3.4 08/17/2023    URINEPROTEIN 0.1 08/17/2023             Obstetric History       OB  History   Gravida Para Term Preterm AB Living   1             SAB IAB Ectopic Multiple Live Births                  # Outcome Date GA Lbr Len/2nd Weight Sex Type Anes PTL Lv   1 Current                 Other Historical Data      Medical History[1]    Past Surgical History[2]    Family History[3]    Social History     Occupational History    Not on file   Tobacco Use    Smoking status: Never     Passive exposure: Never    Smokeless tobacco: Never   Vaping Use    Vaping status: Never Used   Substance and Sexual Activity    Alcohol use: Not Currently    Drug use: Never    Sexual activity: Yes     Partners: Male     Social Drivers of Health with Concerns     Alcohol Use: Not on file   Financial Resource Strain: Not on file   Physical Activity: Not on file   Stress: Not on file   Social Connections: Not on file   Postpartum Depression: Not on file          Allergies[4]      Medications Ordered Prior to Encounter[5]        There is no immunization history on file for this patient.       Elion Hocker MALVA Parsley, MD            [1]  Past Medical History:  Diagnosis Date    Asthma     Hypertension    [2]   Past Surgical History:  Procedure Laterality Date    HIP SURGERY     [3] No family history on file.  [4]   Allergies  Allergen Reactions    Zyrtec [Cetirizine] Anaphylaxis   [5]   No current facility-administered medications on file prior to encounter.     Current Outpatient Medications on File Prior to Encounter   Medication Sig Dispense Refill    NIFEdipine  XL (PROCARDIA  XL) 60 MG 24 hr tablet Take 1 tablet (60 mg) by mouth daily      Prenatal Vit-Fe Fumarate-FA (PRENATAL 1 PLUS 1 PO) Take by mouth      albuterol sulfate HFA (PROVENTIL) 108 (90 Base) MCG/ACT inhaler Inhale 2 puffs into the lungs      budesonide-formoterol (SYMBICORT) 80-4.5 MCG/ACT inhaler Inhale 2 puffs into the lungs 2 (two) times daily

## 2023-08-18 NOTE — Plan of Care (Signed)
 Pt is 28 yo G1P0 that is admitted for [facility]. VS and physical assessment within normal limits. FHR cat 1. GBS -, afebrile. Pt plans to get CEI when active labor starts. Cytotec  25mcg q4hr. Plan of care reviewed with pt and partner. Denies any needs or concerns at this time. Oriented to room. Call light within reach.     Problem: Vaginal/Cesarean Delivery  Goal: Maternal Status within defined parameters  Outcome: Progressing  Flowsheets (Taken 08/18/2023 0948)  Maternal status with defined parameters:   Monitor/assess vital signs   Perform physical assessment per phase of care   Manage complications/co-morbidities per LIP orders  Goal: Evidence of Fetal Well Being  Outcome: Progressing  Flowsheets (Taken 08/18/2023 0948)  Evidence of fetal well being:   Position patient for maximum uterine perfusion   Monitor/assess fetal heart rate. Notify LIP of Category II or III EFM tracings.   Initiate interventions for Category II or III EFM strip   Patient's position should support labor progress and expulsion efforts  Goal: Free from Maternal/Fetal Infection  Outcome: Progressing  Flowsheets (Taken 08/18/2023 0948)  Free from Maternal/Fetal Infection:   Assess for infection risk using the appropriate screening tool   Determine GBS status. If GBS positive, manage per LIP orders.  Goal: Intrapartum management of pain/discomfort  Outcome: Progressing  Flowsheets (Taken 08/18/2023 0948)  Intrapartum management of pain/discomfort:   Keep pain at acceptable level for patient   Assess pain level before and following intervention   Assess pain using a consistent, developmental/age appropriate pain scale   Monitor hemodynamic parameters in response to pain/sedation medications   Assess and manage side effects associated with pain management   Include patient/patient care companion in decisions related to pain management   Support patient for natural childbirth with non-pharmacologic pain management or with desired pharmacologic management  such as epidural   Report ineffective pain management to LIP   Consult/collaborate with Anesthesia or Pain Service

## 2023-08-18 NOTE — Progress Notes (Addendum)
 WOMEN'S SERVICE LINE  LABOR PROGRESS NOTE     Subjective:      Notes some discomfort with contractions but declines analgesia at this time Denies symptoms of preE. Requests to come off the monitor to ambulate on the unit.        Objective:      Patient Vitals for the past 72 hrs (Last 3 readings):   BP Pulse   08/18/23 1315 (!) 143/82 95   08/18/23 1300 (!) 142/81 75   08/18/23 0800 124/70 88        98.3 F (36.8 C) (08/17/2023 10:30 PM)        Cervical Exam:  Dilation: 1 (08/18/23 1747)  Effacement (%): 50 (08/18/23 1747)  Fetal Station: -3 (08/18/23 1747)  OB Examiner: Synthia, MD (08/18/23 1747)  Bishop Score: (!) 3 (08/18/2023  5:47 PM)                    FHT:  Baseline Rate: 135 BPM  Variability: 6-25 BPM  Pattern: Accelerations  FHR Category: Category I    Uterine Activity:   Mode: High BMI Monitor  Contraction Frequency: x1  Contraction Duration: 40  Contraction Quality: Mild  Resting Tone Palpated: Soft  Membranes:   Membrane Status: Intact   Rupture Date:     Rupture Time:     Color:            Lab Results   Component Value Date    GBS Positive 07/30/2023          Assessment and Plan:      A/P: Laura Berger is a 28 y.o. female G1P0 at [redacted]w[redacted]d, Estimated Date of Delivery: 08/23/23 admitted for induction for [facility].  Labor: Early labor. Continue Cytotec  Reevaluate in 3-4 hrs, or as needed.  FHT: Category 1  GBS: Pos, will treat in active labor.  Pain: desires epidural when more uncomfortable  Ok to ambulate the unit for 30 minutes without EFM as FHT cat 1 and pt asymptomatic.

## 2023-08-18 NOTE — Nursing Progress Note (Signed)
Pt can come off the monitor in order to walk the unit for 30 minutes, per Dr. Marjie Skiff. Pt educated to let RN know if she thinks her water breaks, there is painful contractions or vaginal bleeding.     Pt and family verbalize understanding.

## 2023-08-19 ENCOUNTER — Observation Stay (HOSPITAL_BASED_OUTPATIENT_CLINIC_OR_DEPARTMENT_OTHER): Payer: Medicaid Other | Admitting: Anesthesiology

## 2023-08-19 MED ORDER — SODIUM CHLORIDE 0.9 % IV SOLN
INTRAVENOUS | Status: DC | PRN
Start: 2023-08-19 — End: 2023-08-21
  Administered 2023-08-19: 15 mL via EPIDURAL

## 2023-08-19 MED ORDER — METOCLOPRAMIDE HCL 5 MG/ML IJ SOLN
10.0000 mg | Freq: Once | INTRAMUSCULAR | Status: AC | PRN
Start: 2023-08-19 — End: 2023-08-21
  Administered 2023-08-21: 10 mg via INTRAVENOUS

## 2023-08-19 MED ORDER — BUPIVACAINE HCL (PF) 0.5 % IJ SOLN
30.0000 mL | Freq: Once | INTRAMUSCULAR | Status: DC
Start: 2023-08-19 — End: 2023-08-21

## 2023-08-19 MED ORDER — FENTANYL-BUPIVACAINE-NACL 0.2-0.125-0.9 MG/100ML-% EP SOLN
EPIDURAL | Status: DC
Start: 2023-08-19 — End: 2023-08-21
  Administered 2023-08-19: 10 mL/h via EPIDURAL
  Administered 2023-08-20 – 2023-08-21 (×4): 100 mL via EPIDURAL
  Filled 2023-08-19 (×5): qty 100

## 2023-08-19 MED ORDER — LACTATED RINGERS IV BOLUS
500.0000 mL | Freq: Once | INTRAVENOUS | Status: AC
Start: 2023-08-19 — End: 2023-08-19
  Administered 2023-08-19: 500 mL via INTRAVENOUS

## 2023-08-19 MED ORDER — BUPIVACAINE HCL (PF) 0.25 % IJ SOLN
30.0000 mL | Freq: Once | INTRAMUSCULAR | Status: DC
Start: 2023-08-19 — End: 2023-08-21
  Filled 2023-08-19: qty 30

## 2023-08-19 MED ORDER — FENTANYL CITRATE (PF) 50 MCG/ML IJ SOLN (WRAP)
100.0000 ug | Freq: Once | INTRAMUSCULAR | Status: DC
Start: 2023-08-19 — End: 2023-08-21
  Filled 2023-08-19: qty 2

## 2023-08-19 MED ORDER — NALOXONE HCL 0.4 MG/ML IJ SOLN (WRAP)
0.1000 mg | INTRAMUSCULAR | Status: DC | PRN
Start: 2023-08-19 — End: 2023-08-21

## 2023-08-19 MED ORDER — SOD CITRATE-CITRIC ACID 500-334 MG/5ML PO SOLN
30.0000 mL | Freq: Once | ORAL | Status: DC | PRN
Start: 2023-08-19 — End: 2023-08-21

## 2023-08-19 MED ORDER — FAMOTIDINE 10 MG/ML IV SOLN (WRAP)
20.0000 mg | Freq: Once | INTRAVENOUS | Status: DC | PRN
Start: 2023-08-19 — End: 2023-08-21

## 2023-08-19 MED ORDER — EPHEDRINE SULFATE 50 MG/ML IJ/IV SOLN (WRAP)
10.0000 mg | Freq: Once | Status: DC | PRN
Start: 2023-08-19 — End: 2023-08-21

## 2023-08-19 MED ORDER — DINOPROSTONE 10 MG VA INST
10.0000 mg | VAGINAL_INSERT | Freq: Once | VAGINAL | Status: AC
Start: 2023-08-19 — End: 2023-08-19
  Administered 2023-08-19: 10 mg via VAGINAL
  Filled 2023-08-19: qty 1

## 2023-08-19 NOTE — Progress Notes (Signed)
 WOMEN'S SERVICE LINE  LABOR PROGRESS NOTE     Subjective:      Notes some discomfort with contractions but declines analgesia at this time       Objective:      Patient Vitals for the past 72 hrs (Last 3 readings):   BP Pulse   08/19/23 1323 126/58 84   08/19/23 0904 120/59 72   08/19/23 0848 (!) 154/86 81        98 F (36.7 C) (08/19/2023  1:23 PM)      Cervical Exam:  Dilation: 1 (08/19/23 1111)  Effacement (%): 60 (08/19/23 1111)  Fetal Station: -3 (08/19/23 1111)  OB Examiner: Eilene, CNM (08/19/23 1111)  Bishop Score: (!) 3 (08/19/2023 11:11 AM)                  FHT:  Baseline Rate: 130 BPM  Variability: 6-25 BPM  Pattern: Accelerations; Variable decelerations (VD x1)  FHR Category: Category I    Uterine Activity:   Mode: High BMI Monitor  Contraction Frequency: irregular  Contraction Duration: 50--60  Contraction Quality: Mild  Resting Tone Palpated: Soft  Intrauterine Pressure-Contraction (mmHg): soft  Membranes:   Membrane Status: Intact   Rupture Date:     Rupture Time:     Color:          Lab Results   Component Value Date    GBS Positive 07/30/2023        Assessment and Plan:      Minimal labor progress on cytotec  x 10  Pt desires 2 more doses of oral cytotec , and then we can reassess (cervidil  versus epidural + Cooks)    A/P: Laura Berger is a 28 y.o. female G1P0 at [redacted]w[redacted]d, Estimated Date of Delivery: 08/23/23 admitted for induction for gHTN.    Labor: Early labor.   FHT: Category 1  GBS: Positive, will treat in active labor  Pain: desires epidural when more uncomfortable

## 2023-08-19 NOTE — Plan of Care (Signed)
 Problem: Vaginal/Cesarean Delivery  Goal: Maternal Status within defined parameters  Outcome: Progressing  Flowsheets (Taken 08/18/2023 0948 by Harley Vernell Saha, RN)  Maternal status with defined parameters:   Monitor/assess vital signs   Perform physical assessment per phase of care   Manage complications/co-morbidities per LIP orders  Goal: Evidence of Fetal Well Being  Outcome: Progressing  Flowsheets (Taken 08/18/2023 0948 by Harley Vernell Saha, RN)  Evidence of fetal well being:   Position patient for maximum uterine perfusion   Monitor/assess fetal heart rate. Notify LIP of Category II or III EFM tracings.   Initiate interventions for Category II or III EFM strip   Patient's position should support labor progress and expulsion efforts  Goal: Free from Maternal/Fetal Infection  Outcome: Progressing  Flowsheets (Taken 08/18/2023 0948 by Harley Vernell Saha, RN)  Free from Maternal/Fetal Infection:   Assess for infection risk using the appropriate screening tool   Determine GBS status. If GBS positive, manage per LIP orders.  Goal: Intrapartum management of pain/discomfort  Outcome: Progressing  Flowsheets (Taken 08/18/2023 0948 by Harley Vernell Saha, RN)  Intrapartum management of pain/discomfort:   Keep pain at acceptable level for patient   Assess pain level before and following intervention   Assess pain using a consistent, developmental/age appropriate pain scale   Monitor hemodynamic parameters in response to pain/sedation medications   Assess and manage side effects associated with pain management   Include patient/patient care companion in decisions related to pain management   Support patient for natural childbirth with non-pharmacologic pain management or with desired pharmacologic management such as epidural   Report ineffective pain management to LIP   Consult/collaborate with Anesthesia or Pain Service     Problem: Pain interferes with ability to perform ADL  Goal: Pain at  adequate level as identified by patient  Outcome: Progressing  Flowsheets (Taken 08/19/2023 0926)  Pain at adequate level as identified by patient:   Identify patient comfort function goal   Assess for risk of opioid induced respiratory depression, including snoring/sleep apnea. Alert healthcare team of risk factors identified.   Assess pain on admission, during daily assessment and/or before any as needed intervention(s)   Reassess pain within 30-60 minutes of any procedure/intervention, per Pain Assessment, Intervention, Reassessment (AIR) Cycle   Evaluate if patient comfort function goal is met   Evaluate patient's satisfaction with pain management progress   Offer non-pharmacological pain management interventions   Consult/collaborate with Pain Service   Consult/collaborate with Physical Therapy, Occupational Therapy, and/or Speech Therapy   Include patient/patient care companion in decisions related to pain management as needed     Problem: Side Effects from Pain Analgesia  Goal: Patient will experience minimal side effects of analgesic therapy  Outcome: Progressing  Flowsheets (Taken 08/19/2023 0926)  Patient will experience minimal side effects of analgesic therapy:   Monitor/assess patient's respiratory status (RR depth, effort, breath sounds)   Assess for changes in cognitive function   Prevent/manage side effects per LIP orders (i.e. nausea, vomiting, pruritus, constipation, urinary retention, etc.)   Evaluate for opioid-induced sedation with appropriate assessment tool (i.e. POSS)     Problem: Safety  Goal: Patient will be free from injury during hospitalization  Outcome: Progressing  Flowsheets (Taken 08/19/2023 0926)  Patient will be free from injury during hospitalization:   Assess patient's risk for falls and implement fall prevention plan of care per policy   Provide and maintain safe environment   Ensure appropriate safety devices are available at the bedside  Use appropriate transfer methods    Include patient/ family/ care giver in decisions related to safety   Hourly rounding   Assess for patients risk for elopement and implement Elopement Risk Plan per policy   Provide alternative method of communication if needed (communication boards, writing)  Goal: Patient will be free from infection during hospitalization  Outcome: Progressing  Flowsheets (Taken 08/19/2023 0926)  Free from Infection during hospitalization:   Assess and monitor for signs and symptoms of infection   Monitor lab/diagnostic results   Monitor all insertion sites (i.e. indwelling lines, tubes, urinary catheters, and drains)   Encourage patient and family to use good hand hygiene technique     Problem: Psychosocial and Spiritual Needs  Goal: Demonstrates ability to cope with hospitalization/illness  Outcome: Progressing  Flowsheets (Taken 08/19/2023 0926)  Demonstrates ability to cope with hospitalizations/illness:   Encourage verbalization of feelings/concerns/expectations   Provide quiet environment   Assist patient to identify own strengths and abilities   Include patient/ patient care companion in decisions   Encourage participation in diversional activity   Encourage patient to set small goals for self   Reinforce positive adaptation of new coping behaviors   Communicate referral to spiritual care as appropriate     Patient is a G1P0 at [redacted]w[redacted]d who was admitted for IOL for chronic HTN. At this time, FHR Category I and maternal VSS on RA. FOB is supportive and at the bedside. POC discussed and all questions have been addressed and answered, patient verbalizes understanding. Call bell within reach and patient aware to use it if she needs it.

## 2023-08-19 NOTE — Anesthesia Preprocedure Evaluation (Addendum)
 Anesthesia Evaluation    AIRWAY    Mallampati: III         CARDIOVASCULAR           DENTAL                   PULMONARY         OTHER FINDINGS    HTN  ASthma   Morbid Obese BMI ^!                                  Relevant Problems   CARDIO   (+) Chronic hypertension complicating or reason for care during pregnancy, third trimester               Anesthesia Plan    ASA 3     epidural                     intravenous induction   Detailed anesthesia plan: epidural        Post op pain management: epidural    informed consent obtained      pertinent labs reviewed             Signed by: Cora LELON Dunk, MD 08/19/23 6:14 PM

## 2023-08-19 NOTE — Plan of Care (Signed)
 VS and assessment within normal parameters. GBS positive and will receive antibiotic treatment as labor progresses. Afebrile. Pain comfortable with epidural currently. POC discussed and in understanding at this time with all questions/concerns addressed at this time. Call bell at bedside and informed to call RN for any needs. Care ongoing.  Problem: Vaginal/Cesarean Delivery  Goal: Maternal Status within defined parameters  Outcome: Progressing  Flowsheets (Taken 08/19/2023 2034)  Maternal status with defined parameters:   Perform physical assessment per phase of care   Monitor/assess vital signs   Manage complications/co-morbidities per LIP orders     Problem: Vaginal/Cesarean Delivery  Goal: Evidence of Fetal Well Being  Outcome: Progressing  Flowsheets (Taken 08/19/2023 2034)  Evidence of fetal well being:   Monitor/assess fetal heart rate. Notify LIP of Category II or III EFM tracings.   Initiate interventions for Category II or III EFM strip   Position patient for maximum uterine perfusion   Patient's position should support labor progress and expulsion efforts     Problem: Vaginal/Cesarean Delivery  Goal: Free from Maternal/Fetal Infection  Outcome: Progressing  Flowsheets (Taken 08/19/2023 2034)  Free from Maternal/Fetal Infection:   Determine GBS status. If GBS positive, manage per LIP orders.   Assess for infection risk using the appropriate screening tool     Problem: Vaginal/Cesarean Delivery  Goal: Intrapartum management of pain/discomfort  Outcome: Progressing  Flowsheets (Taken 08/19/2023 2034)  Intrapartum management of pain/discomfort:   Keep pain at acceptable level for patient   Assess pain using a consistent, developmental/age appropriate pain scale   Assess pain level before and following intervention   Report ineffective pain management to LIP   Support patient for natural childbirth with non-pharmacologic pain management or with desired pharmacologic management such as epidural   Include  patient/patient care companion in decisions related to pain management   Assess and manage side effects associated with pain management   Monitor hemodynamic parameters in response to pain/sedation medications   Consult/collaborate with Anesthesia or Pain Service

## 2023-08-19 NOTE — Anesthesia Procedure Notes (Signed)
 Epidural    Patient location during procedure: L&D  Reason for block: Labor or C-section  Block at Surgeon's request: Yes      Staffing  Performed: anesthesiologist   Anesthesiologist: Ione Cora ORN, MD  Performed by: Ione Cora ORN, MD  Authorized by: Ione Cora ORN, MD    Pre-procedure Checklist   Completed: patient identified, pre-op evaluation, timeout performed, risks and benefits discussed and anesthesia consent given      Epidural  Patient monitoring: NIBP and Pulse oximetry  Premedication: No and Meaningful Contact Maintained  Patient position: sitting    Skin Local: bupivicaine 0.25%  Dose: 6 mL    Attempts  Number of attempts: 1        Approach: midline    Epidural Space ID: 8.5 cm    Epidural Needle Placement    Needle gauge: 17  Injection technique: LOR air  CSF Return: No  Blood Return: No  CSF Return: No  Blood Return: No  Paresthesia Pain: no and No    Catheter Placement   Catheter type: end hole  Catheter size: 19 G  Catheter at skin depth: 15 cm  Test Dose:negative  Incremental injection: yes   Injection made incrementally with aspirations every 3 mL.    No Catheter IV/SA Signs or SymptomsAssessment   Patient tolerated procedure well: Yes  Block Outcome: patient tolerated procedure well, no complications and pain improved

## 2023-08-19 NOTE — Nursing Progress Note (Signed)
 Cervidil placed at 2044.

## 2023-08-20 ENCOUNTER — Encounter (HOSPITAL_BASED_OUTPATIENT_CLINIC_OR_DEPARTMENT_OTHER): Payer: Self-pay | Admitting: Obstetrics & Gynecology

## 2023-08-20 MED ORDER — OXYTOCIN-SODIUM CHLORIDE 30-0.9 UT/500ML-% IV SOLN
2.0000 m[IU]/min | INTRAVENOUS | Status: DC
Start: 2023-08-20 — End: 2023-08-21
  Administered 2023-08-20: 2 m[IU]/min via INTRAVENOUS
  Filled 2023-08-20: qty 500

## 2023-08-20 MED ORDER — LACTATED RINGERS IV SOLN
INTRAVENOUS | Status: AC
Start: 2023-08-19 — End: 2023-08-22

## 2023-08-20 NOTE — Plan of Care (Signed)
 Pt is a 28 y.o @[redacted]w[redacted]d  IOL for chronic hypertension. Pt is currently resting comfortably w/ epidural in place. No complaints of vag bleeding. Pt and family are aware of call bell usage and all questions concerning care have been addressed at this time.    Problem: Vaginal/Cesarean Delivery  Goal: Maternal Status within defined parameters  Outcome: Progressing  Goal: Evidence of Fetal Well Being  Outcome: Progressing  Goal: Free from Maternal/Fetal Infection  Outcome: Progressing  Goal: Intrapartum management of pain/discomfort  Outcome: Progressing     Problem: Pain interferes with ability to perform ADL  Goal: Pain at adequate level as identified by patient  Outcome: Progressing     Problem: Side Effects from Pain Analgesia  Goal: Patient will experience minimal side effects of analgesic therapy  Outcome: Progressing     Problem: Safety  Goal: Patient will be free from injury during hospitalization  Outcome: Progressing  Goal: Patient will be free from infection during hospitalization  Outcome: Progressing     Problem: Psychosocial and Spiritual Needs  Goal: Demonstrates ability to cope with hospitalization/illness  Outcome: Progressing     Problem: Moderate/High Fall Risk Score >5  Goal: Patient will remain free of falls  Outcome: Progressing

## 2023-08-20 NOTE — Progress Notes (Signed)
 Cervadil removed @0845  08/20/23

## 2023-08-20 NOTE — Plan of Care (Signed)
 Pt is afebrile. Pt understands the plan of care and questions were answered. Pt is coping with labor through breathing techniques, positional changes, mood, epidural, and lightning. Will continue to monitor FHR, uterine activity, maternal VS, and pt pain level. RN will notify MD with any significant changes. Call light within reach and bed in lowest position.     Problem: Vaginal/Cesarean Delivery  Goal: Maternal Status within defined parameters  Outcome: Progressing  Goal: Evidence of Fetal Well Being  Outcome: Progressing  Goal: Free from Maternal/Fetal Infection  Outcome: Progressing  Goal: Intrapartum management of pain/discomfort  Outcome: Progressing     Problem: Pain interferes with ability to perform ADL  Goal: Pain at adequate level as identified by patient  Outcome: Progressing     Problem: Side Effects from Pain Analgesia  Goal: Patient will experience minimal side effects of analgesic therapy  Outcome: Progressing     Problem: Safety  Goal: Patient will be free from injury during hospitalization  Outcome: Progressing  Goal: Patient will be free from infection during hospitalization  Outcome: Progressing     Problem: Psychosocial and Spiritual Needs  Goal: Demonstrates ability to cope with hospitalization/illness  Outcome: Progressing     Problem: Moderate/High Fall Risk Score >5  Goal: Patient will remain free of falls  Outcome: Progressing

## 2023-08-21 ENCOUNTER — Encounter (HOSPITAL_BASED_OUTPATIENT_CLINIC_OR_DEPARTMENT_OTHER)
Admission: RE | Disposition: A | Discharge status: Home / Self Care | Payer: Self-pay | Source: Home / Self Care | Attending: Obstetrics & Gynecology

## 2023-08-21 ENCOUNTER — Encounter (HOSPITAL_BASED_OUTPATIENT_CLINIC_OR_DEPARTMENT_OTHER): Payer: Self-pay | Admitting: Student in an Organized Health Care Education/Training Program

## 2023-08-21 LAB — LAB USE ONLY - CBC WITH DIFFERENTIAL
Absolute Basophils: 0.05 10*3/uL (ref 0.00–0.08)
Absolute Eosinophils: 0.01 10*3/uL (ref 0.00–0.44)
Absolute Immature Granulocytes: 0.03 10*3/uL (ref 0.00–0.07)
Absolute Lymphocytes: 2.54 10*3/uL (ref 0.42–3.22)
Absolute Monocytes: 0.81 10*3/uL (ref 0.21–0.85)
Absolute Neutrophils: 5.58 10*3/uL (ref 1.10–6.33)
Absolute nRBC: 0 10*3/uL (ref <=0.00)
Basophils %: 0.6 %
Eosinophils %: 0.1 %
Hematocrit: 37.1 % (ref 34.7–43.7)
Hemoglobin: 12.3 g/dL (ref 11.4–14.8)
Immature Granulocytes %: 0.3 %
Lymphocytes %: 28.2 %
MCH: 32.1 pg (ref 25.1–33.5)
MCHC: 33.2 g/dL (ref 31.5–35.8)
MCV: 96.9 fL — ABNORMAL HIGH (ref 78.0–96.0)
MPV: 10.7 fL (ref 8.9–12.5)
Monocytes %: 9 %
Neutrophils %: 61.8 %
Platelet Count: 318 10*3/uL (ref 142–346)
Preliminary Absolute Neutrophil Count: 5.58 10*3/uL (ref 1.10–6.33)
RBC: 3.83 10*6/uL — ABNORMAL LOW (ref 3.90–5.10)
RDW: 14 % (ref 11–15)
WBC: 9.02 10*3/uL (ref 3.10–9.50)
nRBC %: 0 /100{WBCs} (ref <=0.0)

## 2023-08-21 LAB — TYPE AND SCREEN
ABO Rh: B POS
Antibody Screen: NEGATIVE

## 2023-08-21 SURGERY — Surgical Case
Anesthesia: Regional | Site: Abdomen | Wound class: Contaminated

## 2023-08-21 MED ORDER — TRANEXAMIC ACID-NACL 1000-0.7 MG/100ML-% IV SOLN
1000.0000 mg | Freq: Once | INTRAVENOUS | Status: DC | PRN
Start: 2023-08-21 — End: 2023-08-24

## 2023-08-21 MED ORDER — OXYCODONE HCL 5 MG PO TABS
5.0000 mg | ORAL_TABLET | ORAL | Status: DC | PRN
Start: 2023-08-21 — End: 2023-08-24
  Administered 2023-08-23 (×2): 5 mg via ORAL
  Filled 2023-08-21 (×3): qty 1

## 2023-08-21 MED ORDER — MEASLES, MUMPS & RUBELLA VAC IJ SOLR
0.5000 mL | INTRAMUSCULAR | Status: DC | PRN
Start: 2023-08-21 — End: 2023-08-24

## 2023-08-21 MED ORDER — STERILE WATER FOR INJECTION IJ/IV SOLN (WRAP)
2.0000 g | INTRAMUSCULAR | Status: AC
Start: 2023-08-21 — End: 2023-08-21
  Administered 2023-08-21: 2 g via INTRAVENOUS
  Filled 2023-08-21: qty 2000

## 2023-08-21 MED ORDER — LACTATED RINGERS IV BOLUS
1000.0000 mL | Freq: Once | INTRAVENOUS | Status: DC
Start: 2023-08-21 — End: 2023-08-21

## 2023-08-21 MED ORDER — NIFEDIPINE 10 MG PO CAPS
20.0000 mg | ORAL_CAPSULE | Freq: Once | ORAL | Status: DC | PRN
Start: 2023-08-21 — End: 2023-08-24

## 2023-08-21 MED ORDER — SIMETHICONE 80 MG PO CHEW
80.0000 mg | CHEWABLE_TABLET | Freq: Four times a day (QID) | ORAL | Status: DC | PRN
Start: 2023-08-21 — End: 2023-08-24
  Administered 2023-08-21 – 2023-08-24 (×6): 80 mg via ORAL
  Filled 2023-08-21 (×6): qty 1

## 2023-08-21 MED ORDER — OXYCODONE HCL 5 MG PO TABS
5.0000 mg | ORAL_TABLET | Freq: Once | ORAL | Status: DC | PRN
Start: 2023-08-21 — End: 2023-08-21

## 2023-08-21 MED ORDER — CALCIUM CARBONATE ANTACID 500 MG PO CHEW
1000.0000 mg | CHEWABLE_TABLET | Freq: Three times a day (TID) | ORAL | Status: DC | PRN
Start: 2023-08-21 — End: 2023-08-24

## 2023-08-21 MED ORDER — COVID-19 MRNA VACC (MODERNA) 50 MCG/0.5ML IM SUSY/SUSP (WRAP)
50.0000 ug | INTRAMUSCULAR | Status: DC | PRN
Start: 2023-08-21 — End: 2023-08-21

## 2023-08-21 MED ORDER — NALOXONE HCL 0.4 MG/ML IJ SOLN (WRAP)
0.2000 mg | INTRAMUSCULAR | Status: DC | PRN
Start: 2023-08-21 — End: 2023-08-24

## 2023-08-21 MED ORDER — ONDANSETRON HCL 4 MG/2ML IJ SOLN
4.0000 mg | Freq: Once | INTRAMUSCULAR | Status: DC | PRN
Start: 2023-08-21 — End: 2023-08-24

## 2023-08-21 MED ORDER — AZITHROMYCIN 500 MG IN 250 ML NS IVPB VIAL-MATE (CNR)
500.0000 mg | INTRAVENOUS | Status: AC
Start: 2023-08-21 — End: 2023-08-21
  Administered 2023-08-21: 500 mg via INTRAVENOUS
  Filled 2023-08-21: qty 250

## 2023-08-21 MED ORDER — CEFAZOLIN SODIUM 1 G IJ SOLR
INTRAMUSCULAR | Status: DC | PRN
Start: 2023-08-21 — End: 2023-08-21
  Administered 2023-08-21: 3 g via INTRAVENOUS

## 2023-08-21 MED ORDER — FENTANYL CITRATE (PF) 50 MCG/ML IJ SOLN (WRAP)
INTRAMUSCULAR | Status: AC
Start: 2023-08-21 — End: ?
  Filled 2023-08-21: qty 2

## 2023-08-21 MED ORDER — ONDANSETRON HCL 4 MG/2ML IJ SOLN
INTRAMUSCULAR | Status: DC | PRN
Start: 2023-08-21 — End: 2023-08-21
  Administered 2023-08-21: 4 mg via INTRAVENOUS

## 2023-08-21 MED ORDER — KETOROLAC TROMETHAMINE 30 MG/ML IJ SOLN
30.0000 mg | Freq: Four times a day (QID) | INTRAMUSCULAR | Status: AC
Start: 2023-08-21 — End: 2023-08-22
  Administered 2023-08-21 – 2023-08-22 (×4): 30 mg via INTRAVENOUS
  Filled 2023-08-21 (×3): qty 1

## 2023-08-21 MED ORDER — IBUPROFEN 600 MG PO TABS
600.0000 mg | ORAL_TABLET | Freq: Four times a day (QID) | ORAL | Status: DC
Start: 2023-08-22 — End: 2023-08-24
  Administered 2023-08-22 – 2023-08-24 (×9): 600 mg via ORAL
  Filled 2023-08-21 (×9): qty 1

## 2023-08-21 MED ORDER — NIFEDIPINE ER OSMOTIC RELEASE 30 MG PO TB24
ORAL_TABLET | ORAL | Status: AC
Start: 2023-08-21 — End: ?
  Filled 2023-08-21: qty 2

## 2023-08-21 MED ORDER — ONDANSETRON HCL 4 MG/2ML IJ SOLN
4.0000 mg | Freq: Three times a day (TID) | INTRAMUSCULAR | Status: DC | PRN
Start: 2023-08-21 — End: 2023-08-24

## 2023-08-21 MED ORDER — PHENYLEPHRINE 100 MCG/ML IV SOSY (WRAP)
PREFILLED_SYRINGE | INTRAVENOUS | Status: AC
Start: 2023-08-21 — End: ?
  Filled 2023-08-21: qty 20

## 2023-08-21 MED ORDER — PROCHLORPERAZINE EDISYLATE 10 MG/2ML IJ SOLN
10.0000 mg | Freq: Four times a day (QID) | INTRAMUSCULAR | Status: DC | PRN
Start: 2023-08-21 — End: 2023-08-24

## 2023-08-21 MED ORDER — MORPHINE SULFATE (PF) 1 MG/ML IJ SOLN
INTRAMUSCULAR | Status: DC | PRN
Start: 2023-08-21 — End: 2023-08-21
  Administered 2023-08-21: 2 mg via EPIDURAL

## 2023-08-21 MED ORDER — MAGNESIUM HYDROXIDE 400 MG/5ML PO SUSP
30.0000 mL | Freq: Four times a day (QID) | ORAL | Status: DC | PRN
Start: 2023-08-21 — End: 2023-08-24

## 2023-08-21 MED ORDER — MORPHINE SULFATE (PF) 1 MG/ML SYRINGE 1 ML (NICU)
INTRAMUSCULAR | Status: AC
Start: 2023-08-21 — End: ?
  Filled 2023-08-21: qty 1

## 2023-08-21 MED ORDER — ONDANSETRON HCL 4 MG/2ML IJ SOLN
INTRAMUSCULAR | Status: AC
Start: 2023-08-21 — End: ?
  Filled 2023-08-21: qty 2

## 2023-08-21 MED ORDER — KETOROLAC TROMETHAMINE 30 MG/ML IJ SOLN
INTRAMUSCULAR | Status: AC
Start: 2023-08-21 — End: ?
  Filled 2023-08-21: qty 1

## 2023-08-21 MED ORDER — FENTANYL CITRATE (PF) 50 MCG/ML IJ SOLN (WRAP)
25.0000 ug | INTRAMUSCULAR | Status: DC | PRN
Start: 2023-08-21 — End: 2023-08-21

## 2023-08-21 MED ORDER — ONDANSETRON HCL 4 MG/2ML IJ SOLN
4.0000 mg | Freq: Three times a day (TID) | INTRAMUSCULAR | Status: DC | PRN
Start: 2023-08-21 — End: 2023-08-21

## 2023-08-21 MED ORDER — METHYLERGONOVINE MALEATE 0.2 MG/ML IJ SOLN
0.2000 mg | Freq: Four times a day (QID) | INTRAMUSCULAR | Status: DC | PRN
Start: 2023-08-21 — End: 2023-08-24

## 2023-08-21 MED ORDER — CARBOPROST TROMETHAMINE 250 MCG/ML IM SOLN
250.0000 ug | Freq: Once | INTRAMUSCULAR | Status: DC | PRN
Start: 2023-08-21 — End: 2023-08-21

## 2023-08-21 MED ORDER — DOCUSATE SODIUM 100 MG PO CAPS
100.0000 mg | ORAL_CAPSULE | Freq: Two times a day (BID) | ORAL | Status: DC
Start: 2023-08-21 — End: 2023-08-24
  Administered 2023-08-21 – 2023-08-24 (×5): 100 mg via ORAL
  Filled 2023-08-21 (×6): qty 1

## 2023-08-21 MED ORDER — SODIUM BICARBONATE 8.4 % IV SOLN
INTRAVENOUS | Status: DC | PRN
Start: 2023-08-21 — End: 2023-08-21
  Administered 2023-08-21 (×4): 3 mL via EPIDURAL

## 2023-08-21 MED ORDER — NALOXONE HCL 0.4 MG/ML IJ SOLN (WRAP)
0.1000 mg | INTRAMUSCULAR | Status: DC | PRN
Start: 2023-08-21 — End: 2023-08-24

## 2023-08-21 MED ORDER — HYDROCORTISONE 1 % EX OINT
TOPICAL_OINTMENT | Freq: Three times a day (TID) | CUTANEOUS | Status: DC | PRN
Start: 2023-08-21 — End: 2023-08-24

## 2023-08-21 MED ORDER — PROMETHAZINE HCL 12.5 MG RE SUPP
25.0000 mg | Freq: Four times a day (QID) | RECTAL | Status: DC | PRN
Start: 2023-08-21 — End: 2023-08-24

## 2023-08-21 MED ORDER — PHENYLEPHRINE 100 MCG/ML IV SYRINGE FOR INFUSION (ANESTHESIA)
PREFILLED_SYRINGE | INTRAVENOUS | Status: DC | PRN
Start: 2023-08-21 — End: 2023-08-21
  Administered 2023-08-21: 25 ug/min via INTRAVENOUS

## 2023-08-21 MED ORDER — PROMETHAZINE HCL 25 MG PO TABS
25.0000 mg | ORAL_TABLET | Freq: Four times a day (QID) | ORAL | Status: DC | PRN
Start: 2023-08-21 — End: 2023-08-21

## 2023-08-21 MED ORDER — PROMETHAZINE HCL 25 MG PO TABS
25.0000 mg | ORAL_TABLET | Freq: Four times a day (QID) | ORAL | Status: DC | PRN
Start: 2023-08-21 — End: 2023-08-24

## 2023-08-21 MED ORDER — FAMOTIDINE 10 MG/ML IV SOLN (WRAP)
20.0000 mg | Freq: Two times a day (BID) | INTRAVENOUS | Status: DC | PRN
Start: 2023-08-21 — End: 2023-08-24

## 2023-08-21 MED ORDER — ACETAMINOPHEN 500 MG PO TABS
1000.0000 mg | ORAL_TABLET | Freq: Four times a day (QID) | ORAL | Status: DC | PRN
Start: 2023-08-21 — End: 2023-08-21

## 2023-08-21 MED ORDER — FAMOTIDINE 20 MG PO TABS
20.0000 mg | ORAL_TABLET | Freq: Two times a day (BID) | ORAL | Status: DC | PRN
Start: 2023-08-21 — End: 2023-08-24

## 2023-08-21 MED ORDER — WITCH HAZEL EX PADS (WRAP)
MEDICATED_PAD | CUTANEOUS | Status: DC | PRN
Start: 2023-08-21 — End: 2023-08-24

## 2023-08-21 MED ORDER — ONDANSETRON HCL 4 MG PO TABS
4.0000 mg | ORAL_TABLET | Freq: Three times a day (TID) | ORAL | Status: DC | PRN
Start: 2023-08-21 — End: 2023-08-24
  Filled 2023-08-21 (×3): qty 1

## 2023-08-21 MED ORDER — BENZOCAINE 20% +/- MENTHOL 0.5% EX AERO (WRAP)
INHALATION_SPRAY | CUTANEOUS | Status: DC | PRN
Start: 2023-08-21 — End: 2023-08-24

## 2023-08-21 MED ORDER — LIDOCAINE-EPINEPHRINE (PF) 2 %-1:200000 IJ SOLN
INTRAMUSCULAR | Status: AC
Start: 2023-08-21 — End: ?
  Filled 2023-08-21: qty 20

## 2023-08-21 MED ORDER — BISACODYL 10 MG RE SUPP
10.0000 mg | Freq: Every day | RECTAL | Status: DC | PRN
Start: 2023-08-21 — End: 2023-08-24

## 2023-08-21 MED ORDER — OXYTOCIN-SODIUM CHLORIDE 30-0.9 UT/500ML-% IV SOLN
125.0000 m[IU]/min | INTRAVENOUS | Status: DC
Start: 2023-08-21 — End: 2023-08-24
  Administered 2023-08-21: 333 m[IU]/min via INTRAVENOUS

## 2023-08-21 MED ORDER — MISOPROSTOL 200 MCG PO TABS
1000.0000 ug | ORAL_TABLET | Freq: Once | ORAL | Status: DC | PRN
Start: 2023-08-21 — End: 2023-08-21

## 2023-08-21 MED ORDER — SENNOSIDES-DOCUSATE SODIUM 8.6-50 MG PO TABS
1.0000 | ORAL_TABLET | Freq: Every evening | ORAL | Status: DC | PRN
Start: 2023-08-21 — End: 2023-08-24

## 2023-08-21 MED ORDER — ONDANSETRON HCL 4 MG PO TABS
4.0000 mg | ORAL_TABLET | Freq: Three times a day (TID) | ORAL | Status: DC | PRN
Start: 2023-08-21 — End: 2023-08-21

## 2023-08-21 MED ORDER — FAMOTIDINE 20 MG PO TABS
20.0000 mg | ORAL_TABLET | Freq: Once | ORAL | Status: AC
Start: 2023-08-21 — End: 2023-08-21
  Administered 2023-08-21: 20 mg via ORAL
  Filled 2023-08-21: qty 1

## 2023-08-21 MED ORDER — CEFAZOLIN SODIUM 1 G IJ SOLR
INTRAMUSCULAR | Status: AC
Start: 2023-08-21 — End: ?
  Filled 2023-08-21: qty 2000

## 2023-08-21 MED ORDER — TETANUS-DIPHTH-ACELL PERTUSSIS 5-2.5-18.5 LF-MCG/0.5 IM SUSP/SUSY (WR)
0.5000 mL | INTRAMUSCULAR | Status: DC | PRN
Start: 2023-08-21 — End: 2023-08-24

## 2023-08-21 MED ORDER — ACETAMINOPHEN 325 MG PO TABS
650.0000 mg | ORAL_TABLET | Freq: Four times a day (QID) | ORAL | Status: DC
Start: 2023-08-21 — End: 2023-08-24
  Administered 2023-08-21 – 2023-08-24 (×10): 650 mg via ORAL
  Filled 2023-08-21 (×11): qty 2

## 2023-08-21 MED ORDER — ACETAMINOPHEN 500 MG PO TABS
1000.0000 mg | ORAL_TABLET | Freq: Once | ORAL | Status: AC
Start: 2023-08-21 — End: 2023-08-21
  Administered 2023-08-21: 1000 mg via ORAL
  Filled 2023-08-21: qty 2

## 2023-08-21 MED ORDER — MISOPROSTOL 200 MCG PO TABS
1000.0000 ug | ORAL_TABLET | Freq: Once | ORAL | Status: DC | PRN
Start: 2023-08-21 — End: 2023-08-24

## 2023-08-21 MED ORDER — HYDROMORPHONE HCL 1 MG/ML IJ SOLN
0.5000 mg | INTRAMUSCULAR | Status: DC | PRN
Start: 2023-08-21 — End: 2023-08-21

## 2023-08-21 MED ORDER — FENTANYL CITRATE (PF) 50 MCG/ML IJ SOLN (WRAP)
INTRAMUSCULAR | Status: DC | PRN
Start: 2023-08-21 — End: 2023-08-21
  Administered 2023-08-21 (×2): 25 ug via INTRAVENOUS

## 2023-08-21 MED ORDER — TRANEXAMIC ACID-NACL 1000-0.7 MG/100ML-% IV SOLN (NARRATOR USE ONLY)
INTRAVENOUS | Status: DC | PRN
Start: 2023-08-21 — End: 2023-08-21
  Administered 2023-08-21: 1000 mg via INTRAVENOUS

## 2023-08-21 MED ORDER — SODIUM BICARBONATE 8.4 % IV SOLN
INTRAVENOUS | Status: AC
Start: 2023-08-21 — End: ?
  Filled 2023-08-21: qty 50

## 2023-08-21 MED ORDER — METHYLERGONOVINE MALEATE 0.2 MG PO TABS
0.2000 mg | ORAL_TABLET | Freq: Four times a day (QID) | ORAL | Status: DC | PRN
Start: 2023-08-21 — End: 2023-08-24

## 2023-08-21 MED ORDER — STERILE WATER FOR INJECTION IJ/IV SOLN (WRAP)
1.0000 g | Freq: Once | INTRAMUSCULAR | Status: DC
Start: 2023-08-21 — End: 2023-08-21
  Filled 2023-08-21: qty 1000

## 2023-08-21 MED ORDER — NIFEDIPINE 10 MG PO CAPS
10.0000 mg | ORAL_CAPSULE | Freq: Once | ORAL | Status: DC | PRN
Start: 2023-08-21 — End: 2023-08-24

## 2023-08-21 MED ORDER — LANOLIN EX OINT
TOPICAL_OINTMENT | CUTANEOUS | Status: DC | PRN
Start: 2023-08-21 — End: 2023-08-24

## 2023-08-21 MED ORDER — ALUM & MAG HYDROXIDE-SIMETH 200-200-20 MG/5ML PO SUSP
30.0000 mL | Freq: Four times a day (QID) | ORAL | Status: DC | PRN
Start: 2023-08-21 — End: 2023-08-24

## 2023-08-21 MED ORDER — SOD CITRATE-CITRIC ACID 500-334 MG/5ML PO SOLN
30.0000 mL | Freq: Once | ORAL | Status: DC | PRN
Start: 2023-08-21 — End: 2023-08-24

## 2023-08-21 MED ORDER — METOCLOPRAMIDE HCL 5 MG/ML IJ SOLN
INTRAMUSCULAR | Status: AC
Start: 2023-08-21 — End: ?
  Filled 2023-08-21: qty 2

## 2023-08-21 MED ORDER — NALBUPHINE HCL 10 MG/ML IJ SOLN
5.0000 mg | Freq: Four times a day (QID) | INTRAMUSCULAR | Status: DC | PRN
Start: 2023-08-21 — End: 2023-08-24
  Administered 2023-08-21: 5 mg via SUBCUTANEOUS
  Filled 2023-08-21: qty 1

## 2023-08-21 MED ORDER — METHYLERGONOVINE MALEATE 0.2 MG/ML IJ SOLN
200.0000 ug | INTRAMUSCULAR | Status: DC | PRN
Start: 2023-08-21 — End: 2023-08-21

## 2023-08-21 MED ORDER — PROMETHAZINE HCL 12.5 MG RE SUPP
25.0000 mg | Freq: Four times a day (QID) | RECTAL | Status: DC | PRN
Start: 2023-08-21 — End: 2023-08-21

## 2023-08-21 MED ORDER — PROCHLORPERAZINE EDISYLATE 10 MG/2ML IJ SOLN
10.0000 mg | Freq: Four times a day (QID) | INTRAMUSCULAR | Status: DC | PRN
Start: 2023-08-21 — End: 2023-08-21

## 2023-08-21 MED ORDER — LACTATED RINGERS IV SOLN
INTRAVENOUS | Status: AC
Start: 2023-08-21 — End: 2023-08-21

## 2023-08-21 MED ORDER — ELITE-OB 50-1.25 MG PO TABS
1.0000 | ORAL_TABLET | Freq: Every day | ORAL | Status: DC
Start: 2023-08-21 — End: 2023-08-24
  Administered 2023-08-22 – 2023-08-24 (×3): 1 via ORAL
  Filled 2023-08-21 (×3): qty 1

## 2023-08-21 SURGICAL SUPPLY — 20 items
CLEANER ELECTROSURGICAL TIP PENCIL (Procedure Accessories) ×1 IMPLANT
CLEANER ELECTROSURGICAL TIP PENCIL HANDSWITCH LECTROBRASIVE (Procedure Accessories) ×1 IMPLANT
ELECTRODE ADULT PATIENT RETURN L9 FT REM POLYHESIVE ACRYLIC FOAM (Procedure Accessories) ×1 IMPLANT
ELECTRODE PATIENT RETURN L9 FT VALLEYLAB (Procedure Accessories) ×1 IMPLANT
GLOVE SURGICAL 8 BIOGEL PI MICRO POWDER (Glove) ×1 IMPLANT
GLOVE SURGICAL 8 BIOGEL PI MICRO POWDER FREE BEAD CUFF MICRO ROUGHENED (Glove) ×1 IMPLANT
PACK SURGICAL C SECTION (Pack) ×1 IMPLANT
PEN SURGICAL MARKING MEDLINE SKIN RULER (Positioning Supplies) ×1 IMPLANT
PEN SURGICAL MARKING SKIN RULER LARGE RESERVOIR REGULAR TIP LABEL (Positioning Supplies) ×1 IMPLANT
SLEEVE COMPRESSION NYLON MEDIUM KNEE (Procedure Accessories) ×1 IMPLANT
SLEEVE COMPRESSION NYLON MEDIUM KNEE LENGTH KENDALL SCD EXPRESS (Procedure Accessories) ×1 IMPLANT
SUTURE COATED VICRYL 0 CT L36 IN BRAID (Suture) ×2 IMPLANT
SUTURE COATED VICRYL 0 CT L36 IN BRAID COATED UNDYED ABSORBABLE (Suture) ×2 IMPLANT
SUTURE COATED VICRYL 0 CT-1 L27 IN BRAID (Suture) ×2 IMPLANT
SUTURE COATED VICRYL 0 CT-1 L27 IN BRAID COATED UNDYED ABSORBABLE (Suture) ×2 IMPLANT
SUTURE COATED VICRYL 3-0 CT-1 L27 IN (Suture) ×1 IMPLANT
SUTURE COATED VICRYL 3-0 CT-1 L27 IN BRAID COATED UNDYED ABSORBABLE (Suture) ×1 IMPLANT
SYSTEM NEGATIVE PRESSURE DRESSING (Dressing) ×1 IMPLANT
SYSTEM NEGATIVE PRESSURE L20 CM 125 MMHG DRESSING INDICATOR CANISTER (Dressing) ×1 IMPLANT
TRAY SURGICAL C SECTION (Pack) ×1 IMPLANT

## 2023-08-21 NOTE — Progress Notes (Signed)
 Labor check  BP 136/77   Pulse 85   Temp 98.4 F (36.9 C) (Oral)   Resp 18   Ht 1.702 m (5' 7)   Wt (!) 176.9 kg (390 lb)   LMP 11/09/2022   SpO2 100%   BMI 61.08 kg/m   Cervix finger tip, long, -3    FHT cat 1  Toco q 2-5    Pt requests cesarean section  due to maternal exhaustion     P) primary cesarean section

## 2023-08-21 NOTE — Anesthesia Postprocedure Evaluation (Signed)
 Anesthesia Post Evaluation    Patient: Laura Berger    Procedure(s):  CESAREAN SECTION    Anesthesia type: epidural    Last Vitals:   Vitals Value Taken Time   BP 143/80 08/21/23 0945   Temp 36.5 C (97.7 F) 08/21/23 0904   Pulse 85 08/21/23 0945   Resp 16 08/21/23 0945   SpO2 100 % 08/21/23 0945                 Anesthesia Post Evaluation:     Patient Evaluated: PACU    Level of Consciousness: awake    Pain Management: adequate  Multimodal analgesia pain management approach  Strategies: local anesthesia, steroids and acetaminophen     Two or more mitigation strategies used for obstructive sleep apnea.  Strategies: multimodal analgesia, verification of full NMB reversal and awake extubation    Anesthetic complications: No      PONV Status: none      Respiratory status: acceptable  Hydration status: acceptable      Signed by: Daneil JAYSON Henle, MD, 08/21/2023 10:06 AM

## 2023-08-21 NOTE — Progress Notes (Signed)
 WOMEN'S SERVICE LINE  LABOR PROGRESS NOTE     Subjective:       Taking over patient this morning. Discussed going to the OR this morning for primary c/s. Patient is ready for delivery.       Objective:      Patient Vitals for the past 72 hrs (Last 3 readings):   BP Pulse   08/21/23 0658 133/70 96   08/21/23 0630 118/58 99   08/21/23 0600 134/69 99        97.9 F (36.6 C) (08/21/2023  7:20 AM)        Cervical Exam:  Dilation: 1 (08/21/23 0145)  Effacement (%): 60 (08/21/23 0145)  Fetal Station: -3 (08/21/23 0145)  OB Examiner: Jackson, MD (08/21/23 0145)  Bishop Score: (!) 3 (08/21/2023  1:45 AM)                    VEVERLY:  Baseline Rate: 140 BPM  Variability: 6-25 BPM  Pattern: Accelerations  FHR Category: Category I    Uterine Activity:   Mode: Toco, Palpation  Contraction Frequency: None noted  Contraction Duration: N/A  Contraction Quality: Not applicable  Resting Tone Palpated: Soft  Intrauterine Pressure-Contraction (mmHg): soft  Membranes:   Membrane Status: Spontaneous   Rupture Date: 08/19/23   Rupture Time: 2115   Color: Clear          Lab Results   Component Value Date    GBS Positive 07/30/2023          Assessment and Plan:      A/P: Laura Berger is a 28 y.o. female G1P0 at [redacted]w[redacted]d, Estimated Date of Delivery: 08/23/23 admitted for induction for cHTN, morbid obesity  Labor: Arrest of progress - first stage labor. and maternal request for c/s Prep for Cesaran Section.  FHT: Category 1  GBS: Positive. Continue antibiotics  Pain: has gotten epidural

## 2023-08-21 NOTE — Plan of Care (Signed)
 Patient admitted to [facility]. VS stable. Admission education completed, all questions and concerns answered. Emergency cord shown. Patient verbalizes understanding. Patient will call to get out of bed. Call bell within reach and bed in lowest position.      Problem: Vaginal/Cesarean Delivery  Goal: Postpartum management of pain/discomfort  Outcome: Progressing  Flowsheets (Taken 08/21/2023 1251)  Postpartum management of pain/discomfort:   Assess pain using a consistent, developmental/age appropriate pain scale   Assess pain level before and following intervention   Include patient/patient care companion in decisions related to pain management   Monitor for post anesthesia issues related to pain management   Offer non-pharmacologic pain management interventions  Goal: Uterine management  Outcome: Progressing  Flowsheets (Taken 08/21/2023 1251)  Uterine management:   Assess fundus and notify LIP if not firm, midline, or at or below the umbilicus, or if abdomen is abnormally distended   Assess for hemorrhage risk using appropriate screening tool  Goal: Incision will be clean, dry, and intact and without discharge or hematoma  Outcome: Progressing  Flowsheets (Taken 08/21/2023 1251)  Incision will be clean, dry and intact and without discharge or hematoma:   Assess abdominal dressing and incision   Monitor incision for signs of infections

## 2023-08-21 NOTE — Op Note (Signed)
 FULL OPERATIVE NOTE    Date Time: 08/21/23 8:44 AM  Patient Name: Laura Berger, Laura Berger (MRN: [id number])  Attending Physician: Synthia Trudee BROCKS, MD      Date of Operation:   08/21/2023    Providers Performing:   Surgeons and Role:     * Drake-Fetterly, Frazier HERO, DO - Primary    Surgical First Assistant(s):   First Assistant: Lorry Heinz Jurist, SA    Operative Procedure:   CESAREAN SECTION:     Preoperative Diagnosis:   Pre-Op Diagnosis Codes:      * Maternal exhaustion complicating labor and delivery [O75.81]    Postoperative Diagnosis:   Post-Op Diagnosis Codes:     * Maternal exhaustion complicating labor and delivery [O75.81]    Anesthesia:   epidural    Indications:   28 yo G1 @[redacted]w[redacted]d  admitted for IOL for cHTN that requested cesarean on day 3 of induction for maternal exhaustion.     Operative Notes:   After obtaining informed consent, the patient was taken to the OR where epidural/spinal anesthesia was placed and noted to be adequate. Patient was placed in the dorsal supine position with a leftward tilt. SCDs were placed on bilateral lower extremities. A Foley catheter was placed to allow bladder drainage. The patient was prepped and draped in the normal sterile fashion. The patient received Ancef  2g and Azithromycin  500 mg prior to incision.    A Pfannenstiel skin incision was made with a scalpel and carried down to the underlying layer of fascia with the Bovie. The fascia was incised with the Bovie and extended bilaterally with Mayo scissors. Attention was turned to the inferior aspect of the fascial incision, which was elevated with Kocher clamps and the underlying rectus muscle dissected off with Mayo scissors and blunt dissection. Attention was then turned to the superior aspect of the fascial incision, which in a similar fashion, was elevated with Kocher clamps and the underlying rectus muscle dissected off with Mayo scissors and blunt dissection. The rectus muscles were separated in the midline. The  peritoneum was identified, entered bluntly, and extended superiorly and inferiorly with good visualization of the bladder. The bladder blade was inserted and the vesicouterine peritoneum identified.    The uterus was incised with a scalpel in a low transverse incision and extended bilaterally with blunt technique. The infant's head was having difficulty from a skin dystocia, so the Pfannenstiel incision was extended and the rectus muscle was incised to make way for baby's head. A vacuum was needed to assist and was applied and two pulls no pop-offs and the head was delivered atraumatically with the body following vacuum was removed.  The oronasopharynx was bulb-suctioned and the cord was clamped and cut. The neonate was subsequently handed off to neonatology. Cord blood was obtained. The placenta was delivered with gentle traction, intact with a 3-vessel cord noted.   The uterus was exteriorized and cleared of all clots and debris. The hysterotomy site was repaired with a running, locked suture of 0-Vicryl. A second layer of 0-Vicryl suture was utilized in a [place] fashion for hysterotomy closure. The uterus was returned to the abdominal cavity and the gutters were cleared of all clots and debris. Attention was returned to the uterine incision, which was found to be hemostatic. Tranexamic Acid  was given for bleeding prophylaxis with Pitocin  for hemostasis.   The fascia was re-approximated with 0-PDS looped in a running fashion. The subcutaneous layer was irrigated. Any bleeding was made hemostatic with the Bovie. The subcutaneous  tissue was re-approximated with 2-0 Plain gut running suture. The skin was re-approximated with 3-0 Monocryl in a subcuticular fashion. Provena wound vac dressing was placed over the hysterotomy. The patient tolerated the procedure well. All sponge, lap, and needle counts were correct times three. The patient was then transported to the PACU in stable condition.     FINDINGS:  Delivery of  a viable female infant, vertex presentation, OP position, 8 lb 10.8 oz (3935 g), Apgars 8/9, no nuchal cord, + meconium. Placenta delivered with gentle traction, intact with a 3-vessel cord. Tubes and ovaries normal bilaterally. 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               Estimated Blood Loss:   700 mL    Implants:   * No implants in log *       Drains:   Drains: Yes, foley      Specimens:   * No specimens in log *      Complications:   None    Signed by: Frazier HERO Drake-Fraticelli, DO  [place] [facility] OR

## 2023-08-21 NOTE — Progress Notes (Signed)
 Patient transferred from [facility] to room 543 at 1400. Report received from [employer]. Admission teaching done prior. Reviewed emergency cord, phone, and safe sleep. Pt aware to call with any needs or questions.

## 2023-08-21 NOTE — Plan of Care (Signed)
 Problem: Vaginal/Cesarean Delivery  Goal: Postpartum management of pain/discomfort  Flowsheets (Taken 08/21/2023 2314)  Postpartum management of pain/discomfort:   Assess pain using a consistent, developmental/age appropriate pain scale   Assess pain level before and following intervention   Include patient/patient care companion in decisions related to pain management   Monitor for post anesthesia issues related to pain management   Offer non-pharmacologic pain management interventions   Report ineffective pain management to LIP   Consult/collaborate with Anesthesia or Pain Service  Goal: Uterine management  Flowsheets (Taken 08/21/2023 2314)  Uterine management:   Assess fundus and notify LIP if not firm, midline, or at or below the umbilicus, or if abdomen is abnormally distended   Assess for hemorrhage risk using appropriate screening tool  Goal: Incision will be clean, dry, and intact and without discharge or hematoma  Flowsheets (Taken 08/21/2023 2314)  Incision will be clean, dry and intact and without discharge or hematoma:   Assess abdominal dressing and incision   Monitor incision for signs of infections   Assess incision for bleeding/hematoma   Assess incision for evidence of healing     Reviewed safety information and POC with patient. Patient verbalized understanding. Patient aware that plan is to remove foley and get patient oob one more time. Patient pain is currently well controlled with toradol  and tylenol . Patient aware oxycodone  is available if she needs it. Vitals are currently stable.

## 2023-08-22 LAB — LAB USE ONLY - CBC WITH DIFFERENTIAL
Absolute Basophils: 0.02 10*3/uL (ref 0.00–0.08)
Absolute Eosinophils: 0.02 10*3/uL (ref 0.00–0.44)
Absolute Immature Granulocytes: 0.07 10*3/uL (ref 0.00–0.07)
Absolute Lymphocytes: 1.63 10*3/uL (ref 0.42–3.22)
Absolute Monocytes: 0.84 10*3/uL (ref 0.21–0.85)
Absolute Neutrophils: 6.06 10*3/uL (ref 1.10–6.33)
Absolute nRBC: 0 10*3/uL (ref <=0.00)
Basophils %: 0.2 %
Eosinophils %: 0.2 %
Hematocrit: 32.2 % — ABNORMAL LOW (ref 34.7–43.7)
Hemoglobin: 11.1 g/dL — ABNORMAL LOW (ref 11.4–14.8)
Immature Granulocytes %: 0.8 %
Lymphocytes %: 18.9 %
MCH: 32.4 pg (ref 25.1–33.5)
MCHC: 34.5 g/dL (ref 31.5–35.8)
MCV: 93.9 fL (ref 78.0–96.0)
MPV: 10.3 fL (ref 8.9–12.5)
Monocytes %: 9.7 %
Neutrophils %: 70.2 %
Platelet Count: 276 10*3/uL (ref 142–346)
Preliminary Absolute Neutrophil Count: 6.06 10*3/uL (ref 1.10–6.33)
RBC: 3.43 10*6/uL — ABNORMAL LOW (ref 3.90–5.10)
RDW: 14 % (ref 11–15)
WBC: 8.64 10*3/uL (ref 3.10–9.50)
nRBC %: 0 /100{WBCs} (ref <=0.0)

## 2023-08-22 MED ORDER — HEPARIN SODIUM (PORCINE) 5000 UNIT/ML IJ SOLN
5000.0000 [IU] | Freq: Three times a day (TID) | INTRAMUSCULAR | Status: DC
Start: 2023-08-22 — End: 2023-08-24
  Administered 2023-08-22 – 2023-08-24 (×6): 5000 [IU] via SUBCUTANEOUS
  Filled 2023-08-22 (×6): qty 1

## 2023-08-22 MED ORDER — NIFEDIPINE ER OSMOTIC RELEASE 30 MG PO TB24
30.0000 mg | ORAL_TABLET | Freq: Every day | ORAL | Status: DC
Start: 2023-08-22 — End: 2023-08-24
  Administered 2023-08-22 – 2023-08-24 (×3): 30 mg via ORAL
  Filled 2023-08-22 (×3): qty 1

## 2023-08-22 NOTE — Progress Notes (Signed)
 Anesthesia Department Obstetric Follow-up    Patient: Laura Berger    Anesthesia type: Epidural    Post-operative pain managment: Epidural Morphine     Complaints/Side Effects:  None    Last vitals:   Vitals:    08/22/23 1015   BP: 128/78   Pulse: 90   Resp:    Temp:    SpO2: 100%        Mental Status:awake    Pain (assessment and plan):  Patient not complaining of pain, continue current therapy     Respiratory Function: tolerating room air    Nausea/Vomiting: patient not complaining of nausea or vomiting    Post assessment: no apparent anesthetic complications

## 2023-08-22 NOTE — Plan of Care (Addendum)
 Patient vital signs stable. Notified Dr. Kathyleen if pt needs Procardia  reordered; order was discontinued last night when pt was admitted to postpartum floor. MD aware and order in for procardia  XL 30 mg once daily. Pt able to ambulate out of bed. Voided adequate urine output 2nd time post foley removal. Lochia within normal limits. Pain managed w/ scheduled meds. Discussed plan of care; pt states understanding. Pt trying to breastfeed w/ assistance from lactation and supplementing w/ formula until her milk comes in. Able to hand express some colostrum and collect in syringes. Answered all questions and concerns at this time.     Problem: Vaginal/Cesarean Delivery  Goal: Postpartum management of pain/discomfort  Outcome: Progressing  Flowsheets (Taken 08/22/2023 1456)  Postpartum management of pain/discomfort:   Assess pain using a consistent, developmental/age appropriate pain scale   Assess pain level before and following intervention   Include patient/patient care companion in decisions related to pain management   Offer non-pharmacologic pain management interventions  Goal: Incision will be clean, dry, and intact and without discharge or hematoma  Outcome: Progressing  Flowsheets (Taken 08/22/2023 1456)  Incision will be clean, dry and intact and without discharge or hematoma:   Assess abdominal dressing and incision   Monitor incision for signs of infections   Assess incision for bleeding/hematoma   Assess incision for evidence of healing     Problem: Safety  Goal: Patient will be free from injury during hospitalization  Outcome: Progressing  Flowsheets (Taken 08/22/2023 1456)  Patient will be free from injury during hospitalization:   Assess patient's risk for falls and implement fall prevention plan of care per policy   Provide and maintain safe environment   Use appropriate transfer methods   Ensure appropriate safety devices are available at the bedside   Include patient/ family/ care giver in decisions related  to safety   Hourly rounding   Provide alternative method of communication if needed (communication boards, writing)

## 2023-08-22 NOTE — Lactation Note (Signed)
 INITIAL LACTATION CONSULTATION    Maternal Data   Maternal Age: 28 y.o. Date of Birth: 1995/07/28  G1P1001  Delivered: 08/21/2023  8:20 AM    Maternal Past Medical History     Past Medical History:   Diagnosis Date    Asthma     Hypertension       Maternal Past Surgical History   Past Surgical History[1]    Previous Breastfeeding Experience: No      Feeding goal: patient goal is to do both breastfeeding and bottle feeding.       Current feeding method: [x]  Breastfeeding  []  DHM  [x]  Formula    Infant Data:   Cesarean at [redacted]w[redacted]d  Female  APGAR: 8  and 9   Age in hours: 18 hours  Birth Weight: 8 lb 10.8 oz (3935 g)    Today's Weight: 3.935 kg (8 lb 10.8 oz)  Weight loss since birth: 0      Subjective:     Patient reports that due to the circumstances of her delivery, she has not been able to breastfeed newborn. Patient attempted to feed newborn during the day with day shift nurse. After couple unsuccessful attempt, patient resulted on offering formula to newborn.       Breast Assessment:     Breast Assessment  Left Breast: Pendulous, Large  Left Nipple: Flat  Right Breast: Pendulous, Large  Right Nipple: Flat  Hand expression done (if baby in NICU): Yes         Feeding Assessment:     Feeding Assessment  Infant Level of Arousal: Infant placed skin to skin     During the lactation consultation with the first-time mother, the focus was on addressing the challenge of flat nipples and supporting her breastfeeding journey with additional techniques. Education was provided on hand expression to help stimulate milk production and relieve any discomfort, offering the mother an alternative to direct breastfeeding when needed. We also discussed paced bottle feeding, emphasizing the importance of mimicking breastfeeding behavior by allowing the baby to suck at their own pace and ensuring they are in an upright position to prevent nipple confusion and promote a natural feeding rhythm. Handouts and visual aids were provided to  reinforce the teachings and techniques discussed during the session. The mother was encouraged to practice these methods regularly and observe her baby's cues to ensure they are feeding effectively. Strategies for improving latch, such as using a nipple shield if necessary, were also explored to support successful breastfeeding. The session provided reassurance and confidence to the mother, ensuring she had the tools and knowledge to navigate these early challenges while continuing to nurture her baby.          Plan:     [x]  Limit breast attempts to 10 minutes  [x]  Hand express/pump  [x]  Supplement via paced bottle feeding    Supplementation:    []   Supplementation NOT needed at this time    [x]   Supplementation medically needed at this time          [x]  10ml per feed, per parents choice of supplementation.         []  EBM  [] DHM  [x] Formula  Reason for medical supplementation:  []  >24 hours-not latching  []  >10% weight loss  []  Hyperbili  []  Late preterm  []  SGA  []  Other      Routine education given for late preterm infants:   [x]   Reviewed importance of calories for baby  [x]   Skin to skin contact encouraged  [x]   Can have decreased stamina    [x]   Build supply with HE/pumping  [x]   Watch baby for signs of effective feedings (adequate number of voids, stools and minimal weight loss)  [x]   Pace bottle feeding          Follow up:  [] Tomorrow []  PRN      Signed by: Yvonne CHRISTELLA Pine, RN BSN,              [1]   Past Surgical History:  Procedure Laterality Date    HIP SURGERY

## 2023-08-22 NOTE — Lactation Note (Signed)
 FOLLOW UP LACTATION CONSULTATION    Maternal Data   Maternal Age: 28 y.o. Date of Birth: 11/21/1995    G1P1001  Delivered: 08/21/2023  8:20 AM    Previous Breastfeeding Experience: No      Infant Data:     Cesarean at [redacted]w[redacted]d  Female    Age in hours: 33 hours    Birth Weight: 8 lb 10.8 oz (3935 g)    Today's Weight: 3.81 kg (8 lb 6.4 oz)    Weight loss since birth: -3.2     Feeding method: [x] Breastfeeding  [] DHM  [x] Formula    Subjective:     Mother had better success last night latching the baby than she has had today. Infant came back from circumcision right before lactation consult.       Breast Assessment:     Breast Assessment  Left Breast: Large; Pendulous; Colostrum Present  Left Nipple: Short  Right Breast: Large; Pendulous  Right Nipple: Short  Hand expression done (if baby in NICU): Yes      Feeding Assessment:     Feeding Assessment  Infant Level of Arousal: Awake  Feeding Positions Used: Psychiatrist, Football (more success w football)  Alignment: Straight line at breast level, Nose to nipple, Chin touching breast  Areolar Grasp: Lips flanged  Areolar Compression: Rhythmic  Suck/Swallow: Coordinated     Assisted mother latching infant on right breast in cross cradle. Infant root and latches well; but mother experiences pain and asks to try football hold instead. Assisted latching infant in football hold on right breast. Again infant roots and latches and sustains for 10+min. Long jaw movements and visible swallows observed. Proper placement of pillows is important. Reviewed with parents hand placement at base of skull and light pressure on shoulders.     Plan:     [x]  Continue frequent breastfeeding on demand  [x]  Hand express    Routine education given:   [x]   Skin to skin contact encouraged  [x]   Offer breast with all hunger cues (at least 8-12 times per 24 hours)    [x]   Watch baby for signs of effective feedings (adequate number of voids, stools and minimal weight loss)  [x]   Reviewed normal  newborn behaviors, including sleep patterns and cluster feeding     Follow up: [x] Tomorrow  [] PRN    Signed by: Leita Lorrayne Hummer, RN BSN, IBCLC

## 2023-08-23 ENCOUNTER — Other Ambulatory Visit: Payer: Self-pay

## 2023-08-23 MED ORDER — OXYCODONE HCL 5 MG PO TABS
5.0000 mg | ORAL_TABLET | ORAL | 0 refills | Status: AC | PRN
Start: 2023-08-23 — End: 2023-08-30
  Filled 2023-08-23: qty 20, 4d supply, fill #0

## 2023-08-23 MED ORDER — IBUPROFEN 600 MG PO TABS
600.0000 mg | ORAL_TABLET | Freq: Four times a day (QID) | ORAL | 0 refills | Status: AC
Start: 2023-08-23 — End: ?
  Filled 2023-08-23: qty 60, 15d supply, fill #0

## 2023-08-23 MED ORDER — DSS 100 MG PO CAPS
100.0000 mg | ORAL_CAPSULE | Freq: Two times a day (BID) | ORAL | 0 refills | Status: AC
Start: 2023-08-23 — End: ?
  Filled 2023-08-23: qty 30, 15d supply, fill #0

## 2023-08-23 NOTE — Plan of Care (Addendum)
 Patient vital signs stable. Pt able to ambulate out of bed. Voided adequate urine output every few hours. Lochia within normas limits. Pain managed w/ scheduled meds and oxycodone  as needed. Discussed plan of care; pt states understanding. Pt trying to breastfeed and doing some hand expression. Supplementing w/ formula until her milk comes in. Answered all questions and concerns at this time.     Problem: Vaginal/Cesarean Delivery  Goal: Postpartum management of pain/discomfort  Outcome: Progressing  Flowsheets (Taken 08/23/2023 1417)  Postpartum management of pain/discomfort:   Assess pain using a consistent, developmental/age appropriate pain scale   Assess pain level before and following intervention   Include patient/patient care companion in decisions related to pain management   Offer non-pharmacologic pain management interventions  Goal: Incision will be clean, dry, and intact and without discharge or hematoma  Outcome: Progressing  Flowsheets (Taken 08/23/2023 1417)  Incision will be clean, dry and intact and without discharge or hematoma:   Assess abdominal dressing and incision   Monitor incision for signs of infections   Assess incision for bleeding/hematoma   Assess incision for evidence of healing     Problem: Safety  Goal: Patient will be free from injury during hospitalization  Outcome: Progressing  Flowsheets (Taken 08/23/2023 1417)  Patient will be free from injury during hospitalization:   Assess patient's risk for falls and implement fall prevention plan of care per policy   Provide and maintain safe environment   Use appropriate transfer methods   Hourly rounding   Include patient/ family/ care giver in decisions related to safety   Ensure appropriate safety devices are available at the bedside   Provide alternative method of communication if needed (communication boards, writing)

## 2023-08-23 NOTE — Progress Notes (Signed)
 Post Op / Postpartum Note    Subjective:  Delivery Type: Cesarean Section   No complaints. Pain well controlled. Minimal Bleeding, Breastfeeding well.    Objective:  Vital Signs:  Temp:  [98.6 F (37 C)] 98.6 F (37 C)  Heart Rate:  [84-93] 84  Resp Rate:  [18-22] 22  BP: (114-138)/(74-83) 114/74  Breast: soft, non tender   Fundus: firm, non tender   Incision: clean, dry and intact   Lochia: moderate, rubra.  Lower Extremities: no edema, NT    Recent Labs:  Recent Labs   Lab 08/22/23  0602   WBC 8.64   Hemoglobin 11.1*   Hematocrit 32.2*   Platelet Count 276       Assessment/Plan:  Stable - Postpartum Day 2  Routine care, discharge tomorrow, F/U in 2 weeks incision check.  Discharge Meds: PNV, Percocet and Motrin .

## 2023-08-23 NOTE — Plan of Care (Signed)
 Problem: Vaginal/Cesarean Delivery  Goal: Postpartum management of pain/discomfort  Flowsheets (Taken 08/23/2023 0111)  Postpartum management of pain/discomfort:   Assess pain using a consistent, developmental/age appropriate pain scale   Assess pain level before and following intervention   Include patient/patient care companion in decisions related to pain management   Offer non-pharmacologic pain management interventions   Monitor for post anesthesia issues related to pain management   Report ineffective pain management to LIP   Consult/collaborate with Anesthesia or Pain Service  Goal: Uterine management  Flowsheets (Taken 08/23/2023 0111)  Uterine management:   Assess fundus and notify LIP if not firm, midline, or at or below the umbilicus, or if abdomen is abnormally distended   Assess for hemorrhage risk using appropriate screening tool  Goal: Incision will be clean, dry, and intact and without discharge or hematoma  Flowsheets (Taken 08/23/2023 0111)  Incision will be clean, dry and intact and without discharge or hematoma:   Assess abdominal dressing and incision   Assess incision for evidence of healing   Monitor incision for signs of infections   Assess incision for bleeding/hematoma  Goal: VTE Prevention  Flowsheets (Taken 08/23/2023 0111)  VTE prevention:   Administer anticoagulant(s) and/or apply anti-embolism stockings/devices as ordered   Patient ambulating independently   Assess skin color, turgor, peripheral pulses, perfusion, and presence of edema, e.g. capillary refill   Patient ambulating with assistance     Reviewed safety information and POC with patient. Patient verbalized understanding. Patient pain is currently being controlled with ibuprofen , tylenol , and oxycodone . Patient vitals are stable at this time. +flatus, BM and patient is voiding with no issues.

## 2023-08-23 NOTE — Discharge Summary (Addendum)
 Obstetrical Discharge Form    Primary OB Clinician:[place]   EDC: Estimated Date of Delivery: 08/23/23    Gestational Age:[redacted]w[redacted]d    Antepartum complications: none    Date of Delivery: 08/21/2023 Time of Delivery:  8:20 AM     Delivered By: CHAPMAN FRAZIER HERO    Delivery Type: Cesarean    Tubal Ligation: n/a    Baby: Liveborn Female; Apgar 1 minute: 8 Apgar 5 minute: 9; Birth weight: 8 lb 10.8 oz (3935 g)    Anesthesia:     Delivery complications:                                          None    Laceration:   Laceration Type: None     Episiotomy: None    Episiotomy/Laceration Repair: None     Placenta:             Feeding method: breast    Rh Immune globulin given: not applicable    Rubella vaccine given: not applicable    Tdap vaccine given:  on discharge    Discharge Date/Time: No discharge date for patient encounter.    Plan:     Discharge home   Follow-up appointment in 6 weeks if vaginal delivery and 2 weeks if c/section.

## 2023-08-23 NOTE — Lactation Note (Signed)
 Rounded on pt to review breastfeeding progress. Mother continues to latch and breastfeed infant with formula supplement. Reports no pain with breastfeeding. Parents decline additional questions and hands on help at this time. Follow up PRN.

## 2023-08-24 ENCOUNTER — Other Ambulatory Visit: Payer: Self-pay

## 2023-08-24 ENCOUNTER — Encounter (HOSPITAL_BASED_OUTPATIENT_CLINIC_OR_DEPARTMENT_OTHER): Payer: Self-pay | Admitting: Student in an Organized Health Care Education/Training Program

## 2023-08-24 NOTE — Plan of Care (Signed)
 Patient is stable and without questions or concerns at this time. Plan of care reviewed this shift and patient verbalizes understanding and continues to progress.    Patient pain level within patient stated goal: Yes  Patient breastfeeding infant: Yes  Patient pumping: Yes  Patient lochia appropriate: Yes, WNL  Foley out and voiding: Yes  Abdominal incision clean, dry and well approximated: Yes  Nutrition adequate for discharge: Yes  Patient reports constipation: No   Patient agrees to notify RN of any changes in her condition/status: Yes      Problem: Vaginal/Cesarean Delivery  Goal: Postpartum management of pain/discomfort  Outcome: Progressing  Flowsheets (Taken 08/24/2023 0526)  Postpartum management of pain/discomfort:   Assess pain using a consistent, developmental/age appropriate pain scale   Assess pain level before and following intervention   Include patient/patient care companion in decisions related to pain management   Monitor for post anesthesia issues related to pain management   Offer non-pharmacologic pain management interventions   Monitor pain infusion pumps   Report ineffective pain management to LIP   Consult/collaborate with Anesthesia or Pain Service  Goal: Uterine management  Flowsheets (Taken 08/24/2023 0526)  Uterine management:   Assess fundus and notify LIP if not firm, midline, or at or below the umbilicus, or if abdomen is abnormally distended   Assess for hemorrhage risk using appropriate screening tool  Goal: Incision will be clean, dry, and intact and without discharge or hematoma  Outcome: Progressing  Flowsheets (Taken 08/24/2023 0526)  Incision will be clean, dry and intact and without discharge or hematoma:   Assess abdominal dressing and incision   Monitor incision for signs of infections   Assess incision for bleeding/hematoma   Assess incision for evidence of healing

## 2023-08-24 NOTE — Plan of Care (Signed)
 Patient pain level within patient stated goal: yes   Patient breastfeeding infant: yes  Patient pumping: no  Patient lochia appropriate: yes, rubra, scant, no clot  Foley out and voiding: foley out   Abdominal incision clean, dry and well approximated: yes   Nutrition adequate for discharge: yes   Patient reports constipation: no  Patient agrees to notify RN of any changes in her condition/status: yes   Pt's VSS and assessments are WNL. Call bell within reach. All questions answered at this time. No further concerns as of present.    Problem: Vaginal/Cesarean Delivery  Goal: Postpartum management of pain/discomfort  Outcome: Completed  Flowsheets (Taken 08/24/2023 0748)  Postpartum management of pain/discomfort:   Assess pain using a consistent, developmental/age appropriate pain scale   Include patient/patient care companion in decisions related to pain management   Report ineffective pain management to LIP   Offer non-pharmacologic pain management interventions   Monitor for post anesthesia issues related to pain management   Consult/collaborate with Anesthesia or Pain Service   Assess pain level before and following intervention     Problem: Vaginal/Cesarean Delivery  Goal: Uterine management  Outcome: Completed  Flowsheets (Taken 08/24/2023 0748)  Uterine management:   Assess fundus and notify LIP if not firm, midline, or at or below the umbilicus, or if abdomen is abnormally distended   Assess for hemorrhage risk using appropriate screening tool     Problem: Vaginal/Cesarean Delivery  Goal: Incision will be clean, dry, and intact and without discharge or hematoma  Outcome: Completed  Flowsheets (Taken 08/24/2023 0748)  Incision will be clean, dry and intact and without discharge or hematoma:   Assess abdominal dressing and incision   Assess incision for evidence of healing   Monitor incision for signs of infections   Assess incision for bleeding/hematoma     Problem: Vaginal/Cesarean Delivery  Goal: Evidence of  positive mother-baby interactions  Outcome: Completed  Flowsheets (Taken 08/24/2023 0748)  Evidence of positive mother-baby interactions:   Include patient/patient care companion in decisions related to care   Assess emotional status and coping mechanisms   Ensure parent/caregiver provides infant care   Notify LIP and case management if risk factors are identified   Assess parent/caregiver engagement and awareness of infant cues/behavior   Initiate safety and falls prevention interventions   Encourage rooming in and infant feeding on demand   Initiate skin to skin

## 2023-08-24 NOTE — Discharge Summary -  Nursing (Signed)
 Patient VSS, ambulating independently, pain well managed, lochia WNL. RN Eben reviewed all discharge teaching handouts. Reviewed signs and symptoms of infection, reasons to call the doctor, PPD and resources, support groups, and med side effects. Pt says she has a support person at home. Pt verbalized understanding and all questions answered.

## 2023-08-25 NOTE — UM Notes (Signed)
 Requesting final auth to be faxed to [phone] or call [phone]        Auth Number :  [id number]    Discharge Date -  08/24/2023  1:41 PM    ER ADMIT DATE AND TIME: 08/17/2023  5:15 PM  OBS ADMIT DATE: 08/17/2023  5:15 PM  IP ADMIT DATE: 08/17/2023  5:15 PM                NAME: Laura Berger             MR#: [id number]      Patient Address:  [address]  [address]  [place] [address]    Patient phone: [phone] (home)     PATIENT NAME: Laura Berger, Laura Berger  DOB: 1995/12/04   PMH:  has a past medical history of Asthma and Hypertension.  PSH:  has a past surgical history that includes Hip surgery and CESAREAN SECTION (N/A, 08/21/2023).      DIAGNOSIS:     ICD-10-CM    1. Chronic hypertension complicating or reason for care during pregnancy, third trimester  O10.913 DISCONTINUED: NIFEdipine  XL (PROCARDIA  XL) 24 hr tablet 30 mg     DISCONTINUED: heparin  (porcine) injection 5,000 Units      2. Encounter for induction of labor  Z34.90           Information sent by a UR Department Case Management Assistant

## 2023-08-27 ENCOUNTER — Emergency Department
Admission: EM | Admit: 2023-08-27 | Discharge: 2023-08-28 | Disposition: A | Discharge status: Home / Self Care | Payer: Medicaid Other | Attending: Obstetrics & Gynecology | Admitting: Obstetrics & Gynecology

## 2023-08-27 ENCOUNTER — Encounter (HOSPITAL_BASED_OUTPATIENT_CLINIC_OR_DEPARTMENT_OTHER): Payer: Self-pay

## 2023-08-27 DIAGNOSIS — O1093 Unspecified pre-existing hypertension complicating the puerperium: Secondary | ICD-10-CM | POA: Insufficient documentation

## 2023-08-27 DIAGNOSIS — O9089 Other complications of the puerperium, not elsewhere classified: Secondary | ICD-10-CM | POA: Insufficient documentation

## 2023-08-27 DIAGNOSIS — O10919 Unspecified pre-existing hypertension complicating pregnancy, unspecified trimester: Secondary | ICD-10-CM

## 2023-08-27 DIAGNOSIS — R519 Headache, unspecified: Secondary | ICD-10-CM | POA: Insufficient documentation

## 2023-08-27 NOTE — EDIE (Signed)
 [facility] NOTIFICATION 08/27/2023 23:34 Venn, Nanci A DOB: 04/18/1996 MRN: [id number]    Criteria Met      5 ED Visits in 12 Months    Security and Safety  No Security Events were found.  ED Care Guidelines  There are currently no ED Care Guidelines for this patient. Please check your facility's medical records system.        Prescription Monitoring Program  Narx Score not available at this time.    E.D. Visit Count (12 mo.)  Facility Visits   [place] - [facility] 4   [place]  [facility] 1   Total 5   Note: Visits indicate total known visits.     Recent Emergency Department Visit Summary  Date Facility [facility] Type Diagnoses or Chief Complaint    Aug 27, 2023  [facility]  [place]  [place]  Emergency      Check PB      Aug 05, 2023  [facility]  [place]  [place]  Emergency      Unspecified pre-existing hypertension complicating pregnancy, unspecified trimester      check BP      Jul 28, 2023  [facility]  [place]  [place]  Emergency      Unspecified pre-existing hypertension complicating pregnancy, third trimester      [redacted] weeks gestation of pregnancy      Decreased fetal movements, third trimester, not applicable or unspecified      Decreased Fetal Movement      Triage      Jul 04, 2023  [place] [facility]  [place]  [place]  Emergency      Tension-type headache, unspecified, not intractable      Pelvic Pain      Hypertension      CHECK BLOOD PRESURE, PELVIC PAIN      Dec 30, 2022  [facility] [place]  [place]  Emergency      Threatened abortion      Vaginal Bleeding - Pregnant      vaginal bleeding        Recent Inpatient Visit Summary  Date Facility [facility] Type Diagnoses or Chief Complaint    Aug 17, 2023  [facility]  [place]    Postpartum      Unspecified pre-existing hypertension complicating pregnancy, third trimester      Encounter for supervision of normal pregnancy, unspecified, unspecified trimester        Care Team  Provider Specialty Phone Fax Service  Dates   [place], [facility]   Current      [facility]  This patient has registered at the [facility] Emergency Department  For more information visit: [email or web address]     PLEASE NOTE:     1.   Any care recommendations and other clinical information are provided as guidelines or for historical purposes only, and providers should exercise their own clinical judgment when providing care.    2.   You may only use this information for purposes of treatment, payment or health care operations activities, and subject to the limitations of applicable [facility] Policies.    3.   You should consult directly with the organization that provided a care guideline or other clinical history with any questions about additional information or accuracy or completeness of information provided.    ? 2025 [facility] - [email or web address]

## 2023-08-28 DIAGNOSIS — O10919 Unspecified pre-existing hypertension complicating pregnancy, unspecified trimester: Secondary | ICD-10-CM

## 2023-08-28 LAB — CBC
Absolute nRBC: 0 10*3/uL (ref <=0.00)
Hematocrit: 32.6 % — ABNORMAL LOW (ref 34.7–43.7)
Hemoglobin: 10.8 g/dL — ABNORMAL LOW (ref 11.4–14.8)
MCH: 31.4 pg (ref 25.1–33.5)
MCHC: 33.1 g/dL (ref 31.5–35.8)
MCV: 94.8 fL (ref 78.0–96.0)
MPV: 9.3 fL (ref 8.9–12.5)
Platelet Count: 380 10*3/uL — ABNORMAL HIGH (ref 142–346)
RBC: 3.44 10*6/uL — ABNORMAL LOW (ref 3.90–5.10)
RDW: 13 % (ref 11–15)
WBC: 7.18 10*3/uL (ref 3.10–9.50)
nRBC %: 0 /100{WBCs} (ref <=0.0)

## 2023-08-28 LAB — FIBRINOGEN: Fibrinogen: 470 mg/dL — ABNORMAL HIGH (ref 181–413)

## 2023-08-28 LAB — AST: AST (SGOT): 31 U/L (ref <=41)

## 2023-08-28 LAB — CREATININE
Creatinine: 0.9 mg/dL (ref 0.4–1.0)
GFR: >60 mL/min/{1.73_m2} (ref >=60.0)

## 2023-08-28 LAB — LACTATE DEHYDROGENASE: LDH: 171 U/L (ref 120–331)

## 2023-08-28 LAB — ALT: ALT: 33 U/L (ref <=55)

## 2023-08-28 LAB — URIC ACID: Uric Acid: 3.9 mg/dL (ref 2.6–7.1)

## 2023-08-28 NOTE — Discharge Instructions (Signed)
 Preeclampsia, Ruled Out     You have been seen for possible Preeclampsia.     Based on the doctor's evaluation and your test results, it has been determined that you DO NOT have preeclampsia at this time.    Call your doctor if you have additional blood pressure readings above 150 systolic or 95 diastolic, or if you have headache that do not get better with pain medicine, visual changes such as seeing spots, stars, wavy lines, pain in your upper abdomen on the right side, or a large increase in swelling.  Ideally, your blood pressure should remain below 140/90. You should address this with your OB doctor or primary care doctor.     Preeclampsia is a serious condition seen in up to 7% of pregnancies (7 out of 100). It is most common in a woman's first pregnancy. It often appears as early as the 20th week of pregnancy up to 7 days after delivery. Preeclampsia causes blood pressure problems, kidney function problems and Central Nervous System (CNS) problems. The cause of preeclampsia is not well-understood.     Some signs and symptoms of preeclampsia are:  Hypertension: This is high blood pressure. In most pregnant women, blood pressure normally goes down. In preeclampsia, the blood pressure gets abnormally high. Even blood pressure that is normal for most people can be too high for pregnancy. A rise above the pregnant woman's normal blood pressure of more than 20 points on the top number, or 10 points on the bottom number, can be too high. For any pregnant woman, blood pressure higher than 140/90, is ABNORMAL.  Proteinuria: This means protein in the urine. Injury to the kidneys causes them to leak protein, which ends up in the urine. In eclampsia, more than 300 mg of protein is seen in 24 hours.  Swelling: Generalized swelling, or swelling of the feet and/or hands is seen in preeclampsia. Sometimes a sign of abnormal swelling is weight gain of several pounds in one week.     Untreated PREECLAMPSIA can progress to  ECLAMPSIA. Eclampsia is a very serious problem. It can lead to death of the mother or the baby. Eclampsia includes worsening of preeclampsia symptoms. It also includes muscle twitching, seizures and coma.     It is very important to watch for these signs and symptoms:  Elevated blood pressure. Have your blood pressure checked regularly. It is too high if the top number is above 140 (or more than 20 points higher than your normal pressure). It is also too high if the bottom number is above 90 (or more than 10 points above your normal pressure).  Swelling of the feet or hands.  Protein in the urine: This can be detected if your doctor has you use urine dipsticks to detect protein.     YOU SHOULD SEEK MEDICAL ATTENTION IMMEDIATELY, EITHER HERE OR AT THE NEAREST EMERGENCY DEPARTMENT, IF ANY OF THE FOLLOWING OCCURS:  Any of the signs and symptoms above develop. This includes high blood pressure, swelling and protein in the urine (if your doctor is having you check this).  A severe headache or vision changes develop.  Sudden weight gain of more than 3 pounds in 24 hours.

## 2023-08-28 NOTE — ED Notes (Signed)
 OBED RN Triage Note    Patient presents to OBED c/o BP of 163/95 at home and 3/10 HA. Pt denies n/v, blurred vision, SOB or abdominal pain. Vitals signs listed below. Will inform Steffanie MD of patient's arrival.    Past Medical History:   Diagnosis Date    Asthma     Hypertension         Past Surgical History[1]    Vitals:    08/27/23 2345 08/27/23 2358   BP:  (!) 148/70   Pulse:  92   Resp:  18   Temp:  99 F (37.2 C)   TempSrc:  Oral   Height: 5' 7 (1.702 m)              [1]   Past Surgical History:  Procedure Laterality Date    CESAREAN SECTION N/A 08/21/2023    Procedure: CESAREAN SECTION;  Surgeon: Chapman Frazier HERO, DO;  Location: [facility] LABOR OR;  Service: Obstetrics;  Laterality: N/A;    HIP SURGERY

## 2023-08-28 NOTE — OB ED Provider Note (Signed)
 [facility] ED PROVIDER NOTE        Chief Complaint   Patient presents with    Hypertension       HPI:   Laura Berger is a 28 y.o. G94P1001 female with an LMP of No LMP recorded. Gestational age of [redacted]w[redacted]d and Estimated Date of Delivery: 08/23/23, who presents to the hospital for: Hypertension    Laura Berger presents to OBED complaining of elevated BP at home and headache.  She is status post a primary cesarean on 08/31/23 for maternal exhaustion. She has a hx of chronic HTN and is taking Procardia  XL 60 mg daily.  She states she had a frontal headache earlier this evening that responded to scheduled Tylenol  and Ibuprofen . She is having mild residual pain. At the time of headache onset, she took her blood pressure and it was 163/95. Aside from this one severe range BP, her blood pressures have been at their baseline 140s/80s.  She denies visual changes, SOB, RUQ pain or increased swelling.    Her prenatal care is significant for chronic HTN and maternal obesity.    Allergies[1]    OB History   Gravida Para Term Preterm AB Living   1 1 1     1    SAB IAB Ectopic Multiple Live Births         0 1      # Outcome Date GA Lbr Len/2nd Weight Sex Type Anes PTL Lv   1 Term 08/21/23 [redacted]w[redacted]d  3.935 kg M CSECT EPI N LIV       Home Medications               albuterol sulfate HFA (PROVENTIL) 108 (90 Base) MCG/ACT inhaler     Inhale 2 puffs into the lungs     budesonide-formoterol (SYMBICORT) 80-4.5 MCG/ACT inhaler     Inhale 2 puffs into the lungs 2 (two) times daily     docusate sodium  (COLACE) 100 MG capsule     Take 1 capsule (100 mg) by mouth every 12 (twelve) hours     ibuprofen  (ADVIL ) 600 MG tablet     Take 1 tablet (600 mg) by mouth every 6 (six) hours     NIFEdipine  XL (PROCARDIA  XL) 60 MG 24 hr tablet     Take 1 tablet (60 mg) by mouth daily     oxyCODONE  (ROXICODONE ) 5 MG immediate release tablet     Take 1 tablet (5 mg) by mouth every 4 (four) hours as needed for Pain     Prenatal Vit-Fe Fumarate-FA (PRENATAL  1 PLUS 1 PO)     Take by mouth            Social History[2]    Medical History[3]    Past Surgical History[4]    Family History[5]      Vital Signs:     Vitals:    08/27/23 2345 08/27/23 2358 08/28/23 0054   BP:  (!) 148/70 (!) 142/80   Pulse:  92 82   Resp:  18    Temp:  99 F (37.2 C)    TempSrc:  Oral    Height: 5' 7 (1.702 m)         Physical Exam:      Well appearing 28 year old woman  BP (!) 142/80   Pulse 82   Temp 99 F (37.2 C) (Oral)   Resp 18   Ht 5' 7 (1.702 m)   BMI 61.08 kg/m  Alert and oriented x 3  HEENT normal  Lungs clear to auscultation  Heart RRR without murmurs  Abdomen: negative Murphy sign. Uterine fundus firm, U-4, non tender.  Incision covered by wound vac  Extremities: +1 pretibial edema  DTRs 2+, no clonus           Labs:     Results for orders placed or performed during the hospital encounter of 08/27/23   CBC without Differential    Collection Time: 08/28/23 12:38 AM   Result Value Ref Range    WBC 7.18 3.10 - 9.50 x10 3/uL    Hemoglobin 10.8 (L) 11.4 - 14.8 g/dL    Hematocrit 67.3 (L) 34.7 - 43.7 %    Platelet Count 380 (H) 142 - 346 x10 3/uL    MPV 9.3 8.9 - 12.5 fL    RBC 3.44 (L) 3.90 - 5.10 x10 6/uL    MCV 94.8 78.0 - 96.0 fL    MCH 31.4 25.1 - 33.5 pg    MCHC 33.1 31.5 - 35.8 g/dL    RDW 13 11 - 15 %    nRBC % 0.0 <=0.0 /100 WBC    Absolute nRBC 0.00 <=0.00 x10 3/uL       MDM and ED Course:     ED Medication Orders (From admission, onward)      None             Medical Decision Making  Amount and/or Complexity of Data Reviewed  Labs: ordered.    28 year old G1P1001 who is now 7 days postpartum from a cesarean section, has hx of chronic HTN, now presenting with a single severe range BP at home and headache that has improved with pain meds.  BP here 140s/80s which pt says is her baseline.  PIH labs unremarkable. Urine PCR not done because of lochia.  No severe range BP here. Does not meet criteria for preeclampsia.  Reviewed BP guidelines and symptoms of preeclampsia.  [place]  to home. Continue Procardia  XL 60 mg. Follow up with primary OB.    Clinical Impression & Disposition:     Clinical Impression  Final diagnoses:   None    Chronic HTN in pregnancy    ED Disposition       None         [place] to home.    New Prescriptions    No medications on file          Ewald Beg G Liberty Seto, MD         [1]   Allergies  Allergen Reactions    Zyrtec [Cetirizine] Anaphylaxis   [2]   Social History  Tobacco Use    Smoking status: Never     Passive exposure: Never    Smokeless tobacco: Never   Vaping Use    Vaping status: Never Used   Substance Use Topics    Alcohol use: Not Currently    Drug use: Never   [3]   Past Medical History:  Diagnosis Date    Asthma     Hypertension    [4]   Past Surgical History:  Procedure Laterality Date    CESAREAN SECTION N/A 08/21/2023    Procedure: CESAREAN SECTION;  Surgeon: Chapman Frazier HERO, DO;  Location: [facility] LAB[facility];  Service: Obstetrics;  Laterality: N/A;    HIP SURGERY     [5] No family history on file.

## 2023-08-28 NOTE — ED Notes (Signed)
 Discharge education completed. Handouts of education given. Pt verbalized understanding of self-care at home and when to notify MD. Wheelchair offered, pt choose to leave unit ambulatory, pt left unit with spouse.

## 2023-09-08 ENCOUNTER — Encounter: Payer: Self-pay | Admitting: Obstetrics & Gynecology

## 2023-09-27 ENCOUNTER — Encounter (HOSPITAL_BASED_OUTPATIENT_CLINIC_OR_DEPARTMENT_OTHER): Payer: Self-pay

## 2023-09-27 ENCOUNTER — Emergency Department
Admission: EM | Admit: 2023-09-27 | Discharge: 2023-09-28 | Disposition: A | Discharge status: Home / Self Care | Attending: Obstetrics & Gynecology | Admitting: Obstetrics & Gynecology

## 2023-09-27 DIAGNOSIS — Z3A39 39 weeks gestation of pregnancy: Secondary | ICD-10-CM | POA: Insufficient documentation

## 2023-09-27 DIAGNOSIS — R1013 Epigastric pain: Secondary | ICD-10-CM | POA: Insufficient documentation

## 2023-09-27 DIAGNOSIS — O26893 Other specified pregnancy related conditions, third trimester: Secondary | ICD-10-CM | POA: Insufficient documentation

## 2023-09-27 NOTE — ED Notes (Signed)
 Pt presents to OBED with upper quadrant abdominal pain and back pain. MD to assess.

## 2023-09-27 NOTE — EDIE (Signed)
 [facility] NOTIFICATION 09/27/2023 23:01 Laura Berger, Laura Berger DOB: 10-17-1995 MRN: [id number]    Criteria Met      5 ED Visits in 12 Months    Security and Safety  No Security Events were found.  ED Care Guidelines  There are currently no ED Care Guidelines for this patient. Please check your facility's medical records system.        Prescription Monitoring Program  Narx Score not available at this time.    E.D. Visit Count (12 mo.)  Facility Visits   [place] [facility] 4   [facility] 1   Total 5   Note: Visits indicate total known visits.     Recent Emergency Department Visit Summary  Date Facility [facility] Type Diagnoses or Chief Complaint    Aug 27, 2023  [facility]  [place]  Emergency      Unspecified pre-existing hypertension complicating pregnancy, unspecified trimester      Hypertension      Check PB      Aug 05, 2023  [facility]  [place]  Emergency      Unspecified pre-existing hypertension complicating pregnancy, unspecified trimester      check BP      Jul 28, 2023  [facility]  [place]  Emergency      Unspecified pre-existing hypertension complicating pregnancy, third trimester      [redacted] weeks gestation of pregnancy      Decreased fetal movements, third trimester, not applicable or unspecified      Decreased Fetal Movement      Triage      Jul 04, 2023  [facility]  [place]  Emergency      Tension-type headache, unspecified, not intractable      Pelvic Pain      Hypertension      CHECK BLOOD PRESURE, PELVIC PAIN      Dec 30, 2022  [facility]  [place]  [place]  Emergency      Threatened abortion      Vaginal Bleeding - Pregnant      vaginal bleeding        Recent Inpatient Visit Summary  Date Facility [facility] Type Diagnoses or Chief Complaint    Aug 17, 2023  [facility]  [place]  Postpartum      Unspecified pre-existing hypertension complicating pregnancy, third trimester      Encounter for supervision of  normal pregnancy, unspecified, unspecified trimester        Care Team  Provider Specialty Phone Fax Service Dates   [place], [facility]   Current      [facility]  This patient has registered at the [facility] Emergency Department  For more information visit: [email or web address]     PLEASE NOTE:     1.   Any care recommendations and other clinical information are provided as guidelines or for historical purposes only, and providers should exercise their own clinical judgment when providing care.    2.   You may only use this information for purposes of treatment, payment or health care operations activities, and subject to the limitations of applicable [facility] Policies.    3.   You should consult directly with the organization that provided Berger care guideline or other clinical history with any questions about additional information or accuracy or completeness of  information provided.    ? 2025 [facility] - [email or web address]

## 2023-09-28 MED ORDER — BISACODYL 10 MG RE SUPP
10.0000 mg | Freq: Once | RECTAL | Status: AC
Start: 2023-09-28 — End: 2023-09-28
  Administered 2023-09-28: 10 mg via RECTAL
  Filled 2023-09-28: qty 1

## 2023-09-28 NOTE — OB ED Provider Note (Signed)
 OBSTETRICAL EMERGENCY DEPARTMENT EVALUATION    Date/Time: 09/28/23, 12:09 AM  Patient Name: Laura Berger  Attending: No att. providers found    CC: pt with upper epigastric pain for the majority of the day.    History of Present Illness:   Laura Berger is a 28 y.o. G1P1001 at [redacted]w[redacted]d, EDD 08/23/2023, by Patient Reported presenting to OBED with severe pain in her epigastric area. Pt points to area under her xyphoid. She took an oxy and gas x.she had the same thin a few days ago and took gas x after trying to have a BM and the pain was there. It helped at the time. Today the pain  did not go away with the gasx so she took an oxy and came in.  On procardia 60mg  XL with CHtn  She denies vaginal bleeding, leakage of fluid, contractions. She endorses normal fetal movement.    Pregnancy complicated by:  Problem List[1]    Obstetrical History:   G1P1001  OB History   Gravida Para Term Preterm AB Living   1 1 1   1    SAB IAB Ectopic Multiple Live Births      0 1      # Outcome Date GA Lbr Len/2nd Weight Sex Type Anes PTL Lv   1 Term 08/21/23 [redacted]w[redacted]d  3.935 kg M CSECT EPI N LIV       Gynecological History:   No LMP recorded.    Past Medical History:     Past Medical History:   Diagnosis Date    Asthma     Hypertension        Past Surgical History:   Past Surgical History[2]    Medications:     Current/Home Medications    ACETAMINOPHEN (TYLENOL) 500 MG TABLET    Take 2 tablets (1,000 mg) by mouth    ALBUTEROL SULFATE HFA (PROVENTIL) 108 (90 BASE) MCG/ACT INHALER    Inhale 2 puffs into the lungs    BUDESONIDE-FORMOTEROL (SYMBICORT) 80-4.5 MCG/ACT INHALER    Inhale 2 puffs into the lungs 2 (two) times daily    DOCUSATE SODIUM (COLACE) 100 MG CAPSULE    Take 1 capsule (100 mg) by mouth every 12 (twelve) hours    IBUPROFEN (ADVIL) 600 MG TABLET    Take 1 tablet (600 mg) by mouth every 6 (six) hours    NIFEDIPINE XL (PROCARDIA XL) 60 MG 24 HR TABLET    Take 1 tablet (60 mg) by mouth daily    OXYCODONE (ROXICODONE) 5 MG  IMMEDIATE RELEASE TABLET    Take 1 tablet (5 mg) by mouth every 4 (four) hours as needed for Pain    PRENATAL VIT-FE FUMARATE-FA (PRENATAL 1 PLUS 1 PO)    Take by mouth        Allergies:   Allergies[3]    Social History:   Social History[4]    Family History:   Family History[5]    Review of Systems:  A ten point review of systems was performed and negative except for those noted in HPI.   All other systems are negative    Physical Exam:   BP (!) 145/75   Pulse 90   Temp 98.2 F (36.8 C) (Oral)   Resp 16   Ht 5\' 7"  (1.702 m)   Wt (!) 176.9 kg   BMI 61.08 kg/m \      Gen:  Alert and oriented x 3, in no acute distress.  Psych:  Mood euthymic, affect appropriate  CV: regular rate   Pulm: nonlabored breathing  Abd: Soft, Gravid, Non-tender. Has point tenderness when she pushes n it below the xyphoid, auscultation reveals drum sound over the area of pain with dull sounds through out the abd  LE:  No deformities, tr edema  Pelvic: normal        Labs & Imaging:   Labs  Results       ** No results found for the last 24 hours. **            Imaging  No results found.      Assessment & Plan:   Laura Berger is a 28 y.o. G1P1001 at [redacted]w[redacted]d with abd pain due to gas and possible constipation, pt counseled about decreasing the oxy use as it will slow gut motility down, to add fiber to her diet with fibercon in juice and then to drink more water. Will give her a dulcolax supp to promote motility in the lower GI tract to set pattern for BM.  Reassuring fetal status    Dispo:   [place] home after dulcolax  Take fiber and clal MD if any worsening      Elliot Gurney, MD  [facility]         [1]   Patient Active Problem List  Diagnosis    Chronic hypertension complicating or reason for care during pregnancy, third trimester   [2]   Past Surgical History:  Procedure Laterality Date    CESAREAN SECTION N/A 08/21/2023    Procedure: CESAREAN SECTION;  Surgeon: Darnelle Bos, DO;  Location: [facility]  LABOR OR;  Service: Obstetrics;  Laterality: N/A;    HIP SURGERY     [3]   Allergies  Allergen Reactions    Zyrtec [Cetirizine] Anaphylaxis   [4]   Social History  Socioeconomic History    Marital status: Significant Other   Tobacco Use    Smoking status: Never     Passive exposure: Never    Smokeless tobacco: Never   Vaping Use    Vaping status: Never Used   Substance and Sexual Activity    Alcohol use: Not Currently    Drug use: Never    Sexual activity: Yes     Partners: Male     Social Drivers of Health     Food Insecurity: No Food Insecurity (08/17/2023)    Hunger Vital Sign     Worried About Running Out of Food in the Last Year: Never true     Ran Out of Food in the Last Year: Never true   Transportation Needs: No Transportation Needs (08/17/2023)    PRAPARE - Therapist, art (Medical): No     Lack of Transportation (Non-Medical): No   Intimate Partner Violence: Not At Risk (08/17/2023)    Humiliation, Afraid, Rape, and Kick questionnaire     Fear of Current or Ex-Partner: No     Emotionally Abused: No     Physically Abused: No     Sexually Abused: No   Housing Stability: Low Risk  (08/17/2023)    Housing Stability Vital Sign     Unable to Pay for Housing in the Last Year: No     Number of Times Moved in the Last Year: 1     Homeless in the Last Year: No   [5] No family history on file.

## 2023-09-28 NOTE — ED Notes (Signed)
 Pt discharged by MD in good condition. Pt states understanding to increase fiber and take dulcolax.

## 2023-09-30 ENCOUNTER — Other Ambulatory Visit: Payer: Self-pay
